# Patient Record
Sex: Female | Born: 1935 | Race: White | Hispanic: No | Smoking: Never smoker
Health system: Southern US, Community
[De-identification: ages and names within clinical notes are randomized; demographics above are authoritative.]

## PROBLEM LIST (undated history)

## (undated) DIAGNOSIS — K5792 Diverticulitis of intestine, part unspecified, without perforation or abscess without bleeding: Secondary | ICD-10-CM

## (undated) DIAGNOSIS — N200 Calculus of kidney: Secondary | ICD-10-CM

## (undated) DIAGNOSIS — I499 Cardiac arrhythmia, unspecified: Secondary | ICD-10-CM

## (undated) DIAGNOSIS — Z86718 Personal history of other venous thrombosis and embolism: Secondary | ICD-10-CM

## (undated) DIAGNOSIS — E039 Hypothyroidism, unspecified: Secondary | ICD-10-CM

## (undated) DIAGNOSIS — E079 Disorder of thyroid, unspecified: Secondary | ICD-10-CM

## (undated) DIAGNOSIS — E785 Hyperlipidemia, unspecified: Secondary | ICD-10-CM

## (undated) DIAGNOSIS — Z87442 Personal history of urinary calculi: Secondary | ICD-10-CM

## (undated) DIAGNOSIS — I10 Essential (primary) hypertension: Secondary | ICD-10-CM

## (undated) DIAGNOSIS — I4891 Unspecified atrial fibrillation: Secondary | ICD-10-CM

## (undated) DIAGNOSIS — I509 Heart failure, unspecified: Secondary | ICD-10-CM

## (undated) DIAGNOSIS — Z860100 Personal history of colon polyps, unspecified: Secondary | ICD-10-CM

## (undated) DIAGNOSIS — K5790 Diverticulosis of intestine, part unspecified, without perforation or abscess without bleeding: Secondary | ICD-10-CM

## (undated) DIAGNOSIS — K8020 Calculus of gallbladder without cholecystitis without obstruction: Secondary | ICD-10-CM

## (undated) DIAGNOSIS — J189 Pneumonia, unspecified organism: Secondary | ICD-10-CM

## (undated) DIAGNOSIS — T8859XA Other complications of anesthesia, initial encounter: Secondary | ICD-10-CM

## (undated) DIAGNOSIS — M1990 Unspecified osteoarthritis, unspecified site: Secondary | ICD-10-CM

## (undated) DIAGNOSIS — C801 Malignant (primary) neoplasm, unspecified: Secondary | ICD-10-CM

## (undated) DIAGNOSIS — K589 Irritable bowel syndrome without diarrhea: Secondary | ICD-10-CM

## (undated) DIAGNOSIS — K769 Liver disease, unspecified: Secondary | ICD-10-CM

## (undated) DIAGNOSIS — K219 Gastro-esophageal reflux disease without esophagitis: Secondary | ICD-10-CM

## (undated) DIAGNOSIS — K222 Esophageal obstruction: Secondary | ICD-10-CM

## (undated) DIAGNOSIS — T4145XA Adverse effect of unspecified anesthetic, initial encounter: Secondary | ICD-10-CM

## (undated) DIAGNOSIS — K7460 Unspecified cirrhosis of liver: Secondary | ICD-10-CM

## (undated) HISTORY — DX: Cardiac arrhythmia, unspecified: I49.9

## (undated) HISTORY — DX: Esophageal obstruction: K22.2

## (undated) HISTORY — PX: BIOPSY BREAST: PRO8

## (undated) HISTORY — DX: Hyperlipidemia, unspecified: E78.5

## (undated) HISTORY — DX: Calculus of kidney: N20.0

## (undated) HISTORY — PX: ABDOMINAL HYSTERECTOMY: SHX81

## (undated) HISTORY — PX: CHOLECYSTECTOMY: SHX55

## (undated) HISTORY — PX: BREAST LUMPECTOMY: SHX2

## (undated) HISTORY — DX: Calculus of gallbladder without cholecystitis without obstruction: K80.20

## (undated) HISTORY — DX: Unspecified osteoarthritis, unspecified site: M19.90

## (undated) HISTORY — PX: EYE SURGERY: SHX253

## (undated) HISTORY — PX: CATARACT EXTRACTION W/ INTRAOCULAR LENS IMPLANT: SHX1309

## (undated) HISTORY — DX: Diverticulitis of intestine, part unspecified, without perforation or abscess without bleeding: K57.92

## (undated) HISTORY — PX: ESOPHAGOGASTRODUODENOSCOPY: SHX1529

## (undated) HISTORY — PX: COLONOSCOPY: SHX174

## (undated) HISTORY — PX: BLADDER SURGERY: SHX569

## (undated) HISTORY — DX: Personal history of other venous thrombosis and embolism: Z86.718

## (undated) HISTORY — DX: Personal history of colon polyps, unspecified: Z86.0100

## (undated) HISTORY — PX: FEMUR SURGERY: SHX943

## (undated) HISTORY — DX: Personal history of colonic polyps: Z86.010

## (undated) HISTORY — DX: Heart failure, unspecified: I50.9

## (undated) HISTORY — DX: Irritable bowel syndrome, unspecified: K58.9

## (undated) HISTORY — PX: APPENDECTOMY: SHX54

## (undated) HISTORY — DX: Hemochromatosis, unspecified: E83.119

## (undated) HISTORY — PX: CARDIOVERSION: SHX1299

## (undated) HISTORY — DX: Liver disease, unspecified: K76.9

## (undated) HISTORY — DX: Diverticulosis of intestine, part unspecified, without perforation or abscess without bleeding: K57.90

## (undated) HISTORY — DX: Unspecified cirrhosis of liver: K74.60

## (undated) HISTORY — PX: HIP SURGERY: SHX245

## (undated) HISTORY — PX: ABDOMINAL SURGERY: SHX537

## (undated) HISTORY — DX: Gastro-esophageal reflux disease without esophagitis: K21.9

---
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
## 2013-11-22 DIAGNOSIS — J9811 Atelectasis: Secondary | ICD-10-CM | POA: Insufficient documentation

## 2015-02-25 DIAGNOSIS — H04123 Dry eye syndrome of bilateral lacrimal glands: Secondary | ICD-10-CM | POA: Diagnosis not present

## 2015-02-25 DIAGNOSIS — Z961 Presence of intraocular lens: Secondary | ICD-10-CM | POA: Diagnosis not present

## 2015-02-25 DIAGNOSIS — H353130 Nonexudative age-related macular degeneration, bilateral, stage unspecified: Secondary | ICD-10-CM | POA: Diagnosis not present

## 2015-05-14 DIAGNOSIS — N189 Chronic kidney disease, unspecified: Secondary | ICD-10-CM | POA: Diagnosis not present

## 2015-05-14 DIAGNOSIS — M79605 Pain in left leg: Secondary | ICD-10-CM | POA: Diagnosis not present

## 2015-05-14 DIAGNOSIS — E039 Hypothyroidism, unspecified: Secondary | ICD-10-CM | POA: Diagnosis not present

## 2015-05-14 DIAGNOSIS — E559 Vitamin D deficiency, unspecified: Secondary | ICD-10-CM | POA: Diagnosis not present

## 2015-05-14 DIAGNOSIS — D519 Vitamin B12 deficiency anemia, unspecified: Secondary | ICD-10-CM | POA: Diagnosis not present

## 2015-05-14 DIAGNOSIS — M545 Low back pain: Secondary | ICD-10-CM | POA: Diagnosis not present

## 2015-05-14 DIAGNOSIS — I4891 Unspecified atrial fibrillation: Secondary | ICD-10-CM | POA: Diagnosis not present

## 2015-05-14 DIAGNOSIS — I10 Essential (primary) hypertension: Secondary | ICD-10-CM | POA: Diagnosis not present

## 2015-05-15 DIAGNOSIS — N281 Cyst of kidney, acquired: Secondary | ICD-10-CM | POA: Diagnosis not present

## 2015-05-23 DIAGNOSIS — N281 Cyst of kidney, acquired: Secondary | ICD-10-CM | POA: Diagnosis not present

## 2015-05-23 DIAGNOSIS — R3121 Asymptomatic microscopic hematuria: Secondary | ICD-10-CM | POA: Diagnosis not present

## 2015-05-24 DIAGNOSIS — M545 Low back pain: Secondary | ICD-10-CM | POA: Diagnosis not present

## 2015-05-24 DIAGNOSIS — M79605 Pain in left leg: Secondary | ICD-10-CM | POA: Diagnosis not present

## 2015-05-28 DIAGNOSIS — M79605 Pain in left leg: Secondary | ICD-10-CM | POA: Diagnosis not present

## 2015-05-28 DIAGNOSIS — M545 Low back pain: Secondary | ICD-10-CM | POA: Diagnosis not present

## 2015-05-30 DIAGNOSIS — M79605 Pain in left leg: Secondary | ICD-10-CM | POA: Diagnosis not present

## 2015-05-30 DIAGNOSIS — M545 Low back pain: Secondary | ICD-10-CM | POA: Diagnosis not present

## 2015-06-04 DIAGNOSIS — M79605 Pain in left leg: Secondary | ICD-10-CM | POA: Diagnosis not present

## 2015-06-04 DIAGNOSIS — M545 Low back pain: Secondary | ICD-10-CM | POA: Diagnosis not present

## 2015-06-06 DIAGNOSIS — M79605 Pain in left leg: Secondary | ICD-10-CM | POA: Diagnosis not present

## 2015-06-06 DIAGNOSIS — M545 Low back pain: Secondary | ICD-10-CM | POA: Diagnosis not present

## 2015-06-11 DIAGNOSIS — M79605 Pain in left leg: Secondary | ICD-10-CM | POA: Diagnosis not present

## 2015-06-11 DIAGNOSIS — M545 Low back pain: Secondary | ICD-10-CM | POA: Diagnosis not present

## 2015-06-13 DIAGNOSIS — M545 Low back pain: Secondary | ICD-10-CM | POA: Diagnosis not present

## 2015-06-13 DIAGNOSIS — M79605 Pain in left leg: Secondary | ICD-10-CM | POA: Diagnosis not present

## 2015-06-19 DIAGNOSIS — Z13220 Encounter for screening for lipoid disorders: Secondary | ICD-10-CM | POA: Diagnosis not present

## 2015-06-19 DIAGNOSIS — Z79899 Other long term (current) drug therapy: Secondary | ICD-10-CM | POA: Diagnosis not present

## 2015-06-19 DIAGNOSIS — Z0000 Encounter for general adult medical examination without abnormal findings: Secondary | ICD-10-CM | POA: Diagnosis not present

## 2015-06-19 DIAGNOSIS — M79605 Pain in left leg: Secondary | ICD-10-CM | POA: Diagnosis not present

## 2015-06-19 DIAGNOSIS — M130 Polyarthritis, unspecified: Secondary | ICD-10-CM | POA: Diagnosis not present

## 2015-06-19 DIAGNOSIS — E039 Hypothyroidism, unspecified: Secondary | ICD-10-CM | POA: Diagnosis not present

## 2015-06-19 DIAGNOSIS — E559 Vitamin D deficiency, unspecified: Secondary | ICD-10-CM | POA: Diagnosis not present

## 2015-06-19 DIAGNOSIS — D519 Vitamin B12 deficiency anemia, unspecified: Secondary | ICD-10-CM | POA: Diagnosis not present

## 2015-06-19 DIAGNOSIS — I4891 Unspecified atrial fibrillation: Secondary | ICD-10-CM | POA: Diagnosis not present

## 2015-06-25 DIAGNOSIS — E782 Mixed hyperlipidemia: Secondary | ICD-10-CM | POA: Diagnosis not present

## 2015-06-25 DIAGNOSIS — I10 Essential (primary) hypertension: Secondary | ICD-10-CM | POA: Diagnosis not present

## 2015-06-25 DIAGNOSIS — I480 Paroxysmal atrial fibrillation: Secondary | ICD-10-CM | POA: Diagnosis not present

## 2015-08-14 DIAGNOSIS — M21612 Bunion of left foot: Secondary | ICD-10-CM | POA: Diagnosis not present

## 2015-08-14 DIAGNOSIS — M21611 Bunion of right foot: Secondary | ICD-10-CM | POA: Diagnosis not present

## 2015-08-14 DIAGNOSIS — I70203 Unspecified atherosclerosis of native arteries of extremities, bilateral legs: Secondary | ICD-10-CM | POA: Diagnosis not present

## 2015-08-14 DIAGNOSIS — B351 Tinea unguium: Secondary | ICD-10-CM | POA: Diagnosis not present

## 2015-08-14 DIAGNOSIS — L851 Acquired keratosis [keratoderma] palmaris et plantaris: Secondary | ICD-10-CM | POA: Diagnosis not present

## 2015-08-19 DIAGNOSIS — M79604 Pain in right leg: Secondary | ICD-10-CM | POA: Diagnosis not present

## 2015-08-19 DIAGNOSIS — T148 Other injury of unspecified body region: Secondary | ICD-10-CM | POA: Diagnosis not present

## 2015-08-19 DIAGNOSIS — L089 Local infection of the skin and subcutaneous tissue, unspecified: Secondary | ICD-10-CM | POA: Diagnosis not present

## 2015-09-13 DIAGNOSIS — S81801A Unspecified open wound, right lower leg, initial encounter: Secondary | ICD-10-CM | POA: Diagnosis not present

## 2015-09-18 DIAGNOSIS — I83012 Varicose veins of right lower extremity with ulcer of calf: Secondary | ICD-10-CM | POA: Diagnosis not present

## 2015-09-18 DIAGNOSIS — L97911 Non-pressure chronic ulcer of unspecified part of right lower leg limited to breakdown of skin: Secondary | ICD-10-CM | POA: Diagnosis not present

## 2015-09-18 DIAGNOSIS — I872 Venous insufficiency (chronic) (peripheral): Secondary | ICD-10-CM | POA: Diagnosis not present

## 2015-09-18 DIAGNOSIS — I8392 Asymptomatic varicose veins of left lower extremity: Secondary | ICD-10-CM | POA: Diagnosis not present

## 2015-09-18 DIAGNOSIS — L97222 Non-pressure chronic ulcer of left calf with fat layer exposed: Secondary | ICD-10-CM | POA: Diagnosis not present

## 2015-09-20 DIAGNOSIS — Z6831 Body mass index (BMI) 31.0-31.9, adult: Secondary | ICD-10-CM | POA: Diagnosis not present

## 2015-09-20 DIAGNOSIS — Z23 Encounter for immunization: Secondary | ICD-10-CM | POA: Diagnosis not present

## 2015-09-20 DIAGNOSIS — E039 Hypothyroidism, unspecified: Secondary | ICD-10-CM | POA: Diagnosis not present

## 2015-09-20 DIAGNOSIS — I10 Essential (primary) hypertension: Secondary | ICD-10-CM | POA: Diagnosis not present

## 2015-09-20 DIAGNOSIS — E559 Vitamin D deficiency, unspecified: Secondary | ICD-10-CM | POA: Diagnosis not present

## 2015-09-20 DIAGNOSIS — E785 Hyperlipidemia, unspecified: Secondary | ICD-10-CM | POA: Diagnosis not present

## 2015-09-20 DIAGNOSIS — H6122 Impacted cerumen, left ear: Secondary | ICD-10-CM | POA: Diagnosis not present

## 2015-09-25 DIAGNOSIS — I87311 Chronic venous hypertension (idiopathic) with ulcer of right lower extremity: Secondary | ICD-10-CM | POA: Diagnosis not present

## 2015-09-25 DIAGNOSIS — I872 Venous insufficiency (chronic) (peripheral): Secondary | ICD-10-CM | POA: Diagnosis not present

## 2015-09-25 DIAGNOSIS — L97222 Non-pressure chronic ulcer of left calf with fat layer exposed: Secondary | ICD-10-CM | POA: Diagnosis not present

## 2015-09-25 DIAGNOSIS — L97811 Non-pressure chronic ulcer of other part of right lower leg limited to breakdown of skin: Secondary | ICD-10-CM | POA: Diagnosis not present

## 2015-10-02 DIAGNOSIS — Z872 Personal history of diseases of the skin and subcutaneous tissue: Secondary | ICD-10-CM | POA: Diagnosis not present

## 2015-10-02 DIAGNOSIS — Z09 Encounter for follow-up examination after completed treatment for conditions other than malignant neoplasm: Secondary | ICD-10-CM | POA: Diagnosis not present

## 2015-10-02 DIAGNOSIS — I872 Venous insufficiency (chronic) (peripheral): Secondary | ICD-10-CM | POA: Diagnosis not present

## 2015-10-10 DIAGNOSIS — H11151 Pinguecula, right eye: Secondary | ICD-10-CM | POA: Diagnosis not present

## 2015-10-31 ENCOUNTER — Emergency Department (HOSPITAL_COMMUNITY): Payer: Medicare Other

## 2015-10-31 ENCOUNTER — Emergency Department (HOSPITAL_COMMUNITY)
Admission: EM | Admit: 2015-10-31 | Discharge: 2015-10-31 | Disposition: A | Discharge status: Home / Self Care | Payer: Medicare Other | Attending: Emergency Medicine | Admitting: Emergency Medicine

## 2015-10-31 ENCOUNTER — Encounter (HOSPITAL_COMMUNITY): Payer: Self-pay

## 2015-10-31 DIAGNOSIS — I10 Essential (primary) hypertension: Secondary | ICD-10-CM | POA: Diagnosis not present

## 2015-10-31 DIAGNOSIS — R1032 Left lower quadrant pain: Secondary | ICD-10-CM | POA: Diagnosis present

## 2015-10-31 DIAGNOSIS — K5732 Diverticulitis of large intestine without perforation or abscess without bleeding: Secondary | ICD-10-CM | POA: Insufficient documentation

## 2015-10-31 DIAGNOSIS — K5792 Diverticulitis of intestine, part unspecified, without perforation or abscess without bleeding: Secondary | ICD-10-CM | POA: Diagnosis not present

## 2015-10-31 DIAGNOSIS — Z859 Personal history of malignant neoplasm, unspecified: Secondary | ICD-10-CM | POA: Insufficient documentation

## 2015-10-31 HISTORY — DX: Unspecified atrial fibrillation: I48.91

## 2015-10-31 HISTORY — DX: Essential (primary) hypertension: I10

## 2015-10-31 HISTORY — DX: Disorder of thyroid, unspecified: E07.9

## 2015-10-31 HISTORY — DX: Malignant (primary) neoplasm, unspecified: C80.1

## 2015-10-31 LAB — URINALYSIS, ROUTINE W REFLEX MICROSCOPIC
BILIRUBIN URINE: NEGATIVE
Glucose, UA: NEGATIVE mg/dL
Ketones, ur: NEGATIVE mg/dL
NITRITE: NEGATIVE
PH: 6 (ref 5.0–8.0)
Protein, ur: NEGATIVE mg/dL
SPECIFIC GRAVITY, URINE: 1.011 (ref 1.005–1.030)

## 2015-10-31 LAB — CBC WITH DIFFERENTIAL/PLATELET
BASOS PCT: 1 %
Basophils Absolute: 0.1 10*3/uL (ref 0.0–0.1)
EOS ABS: 0.1 10*3/uL (ref 0.0–0.7)
EOS PCT: 1 %
HCT: 40.8 % (ref 36.0–46.0)
HEMOGLOBIN: 13.2 g/dL (ref 12.0–15.0)
LYMPHS ABS: 2.2 10*3/uL (ref 0.7–4.0)
Lymphocytes Relative: 24 %
MCH: 32.4 pg (ref 26.0–34.0)
MCHC: 32.4 g/dL (ref 30.0–36.0)
MCV: 100.2 fL — ABNORMAL HIGH (ref 78.0–100.0)
Monocytes Absolute: 1.2 10*3/uL — ABNORMAL HIGH (ref 0.1–1.0)
Monocytes Relative: 13 %
NEUTROS PCT: 61 %
Neutro Abs: 5.7 10*3/uL (ref 1.7–7.7)
Platelets: 297 10*3/uL (ref 150–400)
RBC: 4.07 MIL/uL (ref 3.87–5.11)
RDW: 13.8 % (ref 11.5–15.5)
WBC: 9.3 10*3/uL (ref 4.0–10.5)

## 2015-10-31 LAB — COMPREHENSIVE METABOLIC PANEL
ALBUMIN: 3.4 g/dL — AB (ref 3.5–5.0)
ALK PHOS: 69 U/L (ref 38–126)
ALT: 14 U/L (ref 14–54)
AST: 16 U/L (ref 15–41)
Anion gap: 6 (ref 5–15)
BILIRUBIN TOTAL: 0.8 mg/dL (ref 0.3–1.2)
BUN: 8 mg/dL (ref 6–20)
CALCIUM: 8.7 mg/dL — AB (ref 8.9–10.3)
CO2: 26 mmol/L (ref 22–32)
Chloride: 106 mmol/L (ref 101–111)
Creatinine, Ser: 0.71 mg/dL (ref 0.44–1.00)
GFR calc Af Amer: >60 mL/min (ref >60)
GFR calc non Af Amer: >60 mL/min (ref >60)
GLUCOSE: 104 mg/dL — AB (ref 65–99)
Potassium: 3.7 mmol/L (ref 3.5–5.1)
Sodium: 138 mmol/L (ref 135–145)
TOTAL PROTEIN: 6.4 g/dL — AB (ref 6.5–8.1)

## 2015-10-31 LAB — URINE MICROSCOPIC-ADD ON

## 2015-10-31 LAB — LIPASE, BLOOD: Lipase: 31 U/L (ref 11–51)

## 2015-10-31 LAB — I-STAT CG4 LACTIC ACID, ED: Lactic Acid, Venous: 0.87 mmol/L (ref 0.5–1.9)

## 2015-10-31 MED ORDER — CEFDINIR 300 MG PO CAPS: 300.0000 mg | ORAL_CAPSULE | Freq: Two times a day (BID) | ORAL | 0 refills | Status: AC

## 2015-10-31 MED ORDER — FENTANYL CITRATE (PF) 100 MCG/2ML IJ SOLN: 25.0000 ug | Freq: Once | INTRAMUSCULAR | Status: DC

## 2015-10-31 MED ORDER — METRONIDAZOLE 500 MG PO TABS: 500.0000 mg | ORAL_TABLET | Freq: Three times a day (TID) | ORAL | 0 refills | Status: AC

## 2015-10-31 NOTE — ED Notes (Signed)
Pt ambulated to restroom with assistance and her walker. Pt tolerated well.

## 2015-10-31 NOTE — ED Triage Notes (Signed)
Pt presents with 1 week h/o abdominal pain that is to lower abdomen, reports pain is constant; and increases with bowel movements and eating.  Pt seen at a urgent care today and referred here.

## 2015-10-31 NOTE — ED Provider Notes (Signed)
[facility] DEPT Provider Note   CSN: [id number] Arrival date & time: 10/31/15  1226     History   Chief Complaint Chief Complaint  Patient presents with  . Abdominal Pain    HPI Deborah Ball is a 82 y.o. female.  HPI 82 year old female with past medical history of hypertension and A. fib on [place] who presents with bilateral lower quadrant abdominal pain patient has an extensive history of recurrent diverticulitis but has never required surgery for this. She states that over the last week she has had per grossly worsening gradual onset of bilateral lower quadrant pain worse on the left greater than right. She describes the pain as an aching gnawing sensation that is made worse with bowel movements. She has had some mild associated nausea but no vomiting no blood in her stools no fevers or chills her symptoms feel similar to her previous episodes of diverticulitis she presented to urgent care earlier today who sent her to the ED for further evaluation  Past Medical History:  Diagnosis Date  . A-fib ([place])   . Cancer ([place])   . Hypertension   . Thyroid disease     There are no active problems to display for this patient.   Past Surgical History:  Procedure Laterality Date  . ABDOMINAL HYSTERECTOMY    . ABDOMINAL SURGERY    . APPENDECTOMY    . BREAST SURGERY    . CHOLECYSTECTOMY      OB History    No data available       Home Medications    Prior to Admission medications   Medication Sig Start Date End Date Taking? Authorizing Provider  cefdinir (OMNICEF) 300 MG capsule Take 1 capsule (300 mg total) by mouth 2 (two) times daily. 10/31/15 11/10/15  Duffy Bruce, MD  metroNIDAZOLE (FLAGYL) 500 MG tablet Take 1 tablet (500 mg total) by mouth 3 (three) times daily. 10/31/15 11/10/15  Duffy Bruce, MD    Family History History reviewed. No pertinent family history.  Social History Social History  Substance Use Topics  . Smoking status: Never Smoker  .  Smokeless tobacco: Never Used  . Alcohol use No     Allergies   Review of patient's allergies indicates not on file.   Review of Systems Review of Systems  Constitutional: Negative for chills and fever.  HENT: Negative for congestion, rhinorrhea and sore throat.   Eyes: Negative for visual disturbance.  Respiratory: Negative for cough and wheezing.   Cardiovascular: Negative for chest pain and leg swelling.  Gastrointestinal: Positive for abdominal pain and nausea. Negative for blood in stool, diarrhea and vomiting.  Genitourinary: Negative for dysuria, flank pain, vaginal bleeding and vaginal discharge.  Musculoskeletal: Negative for neck pain.  Skin: Negative for rash.  Allergic/Immunologic: Negative for immunocompromised state.  Neurological: Negative for syncope and headaches.  Hematological: Does not bruise/bleed easily.  All other systems reviewed and are negative.    Physical Exam Updated Vital Signs BP 124/71 (BP Location: Right Arm)   Pulse 71   Temp 97.9 F (36.6 C) (Oral)   Resp 19   Ht 5\' 4"  (1.626 m)   Wt 175 lb (79.4 kg)   SpO2 95%   BMI 30.04 kg/m   Physical Exam  Constitutional: She is oriented to person, place, and time. She appears well-developed and well-nourished. No distress.  HENT:  Head: Normocephalic and atraumatic.  Mouth/Throat: Oropharynx is clear and moist.  Eyes: Conjunctivae are normal. Pupils are equal, round, and reactive to light.  Neck: Neck supple.  Cardiovascular: Normal rate, regular rhythm and normal heart sounds.  Exam reveals no friction rub.   No murmur heard. Pulmonary/Chest: Effort normal and breath sounds normal. No respiratory distress. She has no wheezes. She has no rales.  Abdominal: Soft. Bowel sounds are normal. She exhibits no distension. There is tenderness (Mild tenderness in bilateral lower quadrants with no rebound or guarding). There is no rebound and no guarding.  Musculoskeletal: She exhibits no edema.    Neurological: She is alert and oriented to person, place, and time. She exhibits normal muscle tone.  Skin: Skin is warm. Capillary refill takes less than 2 seconds.  Psychiatric: She has a normal mood and affect.  Nursing note and vitals reviewed.    ED Treatments / Results  Labs (all labs ordered are listed, but only abnormal results are displayed) Labs Reviewed  CBC WITH DIFFERENTIAL/PLATELET - Abnormal; Notable for the following:       Result Value   MCV 100.2 (*)    Monocytes Absolute 1.2 (*)    All other components within normal limits  COMPREHENSIVE METABOLIC PANEL - Abnormal; Notable for the following:    Glucose, Bld 104 (*)    Calcium 8.7 (*)    Total Protein 6.4 (*)    Albumin 3.4 (*)    All other components within normal limits  URINALYSIS, ROUTINE W REFLEX MICROSCOPIC (NOT AT [facility]) - Abnormal; Notable for the following:    APPearance CLOUDY (*)    Hgb urine dipstick TRACE (*)    Leukocytes, UA TRACE (*)    All other components within normal limits  URINE MICROSCOPIC-ADD ON - Abnormal; Notable for the following:    Squamous Epithelial / LPF 0-5 (*)    Bacteria, UA RARE (*)    All other components within normal limits  LIPASE, BLOOD  I-STAT CG4 LACTIC ACID, ED  I-STAT CG4 LACTIC ACID, ED    EKG  EKG Interpretation None       Radiology Ct Abdomen Pelvis Wo Contrast  Result Date: 10/31/2015 CLINICAL DATA:  Lower abdominal pain EXAM: CT ABDOMEN AND PELVIS WITHOUT CONTRAST TECHNIQUE: Multidetector CT imaging of the abdomen and pelvis was performed following the standard protocol without IV contrast. COMPARISON:  None. FINDINGS: Lower chest: No pulmonary nodules. No visible pleural or pericardial effusion. There is atherosclerotic calcification within the coronary arteries and aorta. Hepatobiliary: There are multiple low-attenuation lesions within the liver, with the largest measuring 4.4 cm. These are most consistent with hepatic cysts. Some of the lesions are  less than 1 cm in size and best too small to characterize accurately. No ascites. No biliary dilatation. Gallbladder is absent. A calcified granuloma is noted in the liver. Pancreas: Normal pancreatic contours. No peripancreatic fluid collection or pancreatic ductal dilatation. Spleen: Normal. Adrenals/Urinary Tract: Normal adrenal glands. Multiple bilateral low-attenuation renal lesions, smaller than 1 cm in too small to characterize accurately but statistically most likely to be renal cysts. No hydronephrosis. Stomach/Bowel: There is wall thickening of the sigmoid colon with associated adjacent inflammatory stranding. There is no fluid collection or free intraperitoneal air. There are numerous sigmoid diverticula. The remainder of the colon is normal. There is no small bowel dilatation. Despite review of multiplanar reformatted images in 3 orthogonal planes, the appendix could not be adequately visualized. However, there is no inflammatory stranding or free fluid within the right lower quadrant. Vascular/Lymphatic: There is atherosclerotic calcification of the abdominal aorta and its branches. No aneurysm. No abdominal or pelvic adenopathy. Reproductive:  Complex cystic appearance of the left adnexa. Unremarkable uterus and right ovary. Musculoskeletal: Intra medullary rod within the proximal right femur with interlocking screw traversing the femoral neck. Multilevel lumbar osteophytosis and facet arthrosis. No bony spinal canal stenosis. No lytic or blastic lesions. Normal visualized extrathoracic and extraperitoneal soft tissues. Other: No contributory non-categorized findings. IMPRESSION: 1. Acute diverticulitis of the sigmoid colon. No evidence of perforation or abscess formation. 2. Multiple low-attenuation liver lesions, favored to represent hepatic cysts, but incompletely evaluated in the absence of IV contrast. 3. **An incidental finding of potential clinical significance has been found. Complex cystic  focus of the left adnexa. In a patient of this age, further characterization with pelvic ultrasound is recommended. This follows the recommendations of the white paper Managing Incidental Findings on Abdominal and Pelvic CT and MRI: Part 1: White Paper of the ACR Incidental Findings Committee II on Adnexal Findings. Posey Pronto MD et al. Bernette Redbird 2013 Sept;10(9):675-681.** Electronically Signed   By: Ulyses Jarred M.D.   On: 10/31/2015 14:57    Procedures Procedures (including critical care time)  Medications Ordered in ED Medications  fentaNYL (SUBLIMAZE) injection 25 mcg (25 mcg Intravenous Refused 10/31/15 1605)     Initial Impression / Assessment and Plan / ED Course  I have reviewed the triage vital signs and the nursing notes.  Pertinent labs & imaging results that were available during my care of the patient were reviewed by me and considered in my medical decision making (see chart for details).  Clinical Course  82 year old female with past medical history of A. fib, hypertension, and recurrent diverticulitis who presents with bilateral lower quadrant abdominal pain. No fevers or chills. Symptoms feel similar to her previous episodes of diverticulitis. On arrival, vital signs are stable and within normal limits. She's had no fevers or signs of systemic illness. Labwork shows no leukocytosis, normal renal function, and no other significant abnormalities. CT scan obtained shows acute, uncompensated diverticulitis. No evidence of abscess or perforation. She does have a left adnexal mass which she is aware of based my discussion with her.Given patient's otherwise well appearance, tolerance of by mouth,and absence of any leukocytosis, fever, or signs of systemic infection, will discharge with antibiotics. Patient has significant allergies but based on discussion with the patient as well as pharmacy, will send home on [place] with Flagyl and outpatient follow-up.  Final Clinical Impressions(s) / ED  Diagnoses   Final diagnoses:  Diverticulitis of large intestine without perforation or abscess without bleeding    New Prescriptions Discharge Medication List as of 10/31/2015  3:28 PM    START taking these medications   Details  cefdinir (OMNICEF) 300 MG capsule Take 1 capsule (300 mg total) by mouth 2 (two) times daily., Starting Thu 10/31/2015, Until Sun 11/10/2015, Print    metroNIDAZOLE (FLAGYL) 500 MG tablet Take 1 tablet (500 mg total) by mouth 3 (three) times daily., Starting Thu 10/31/2015, Until Sun 11/10/2015, Print         Duffy Bruce, MD 10/31/15 [phone]

## 2015-11-04 DIAGNOSIS — H11151 Pinguecula, right eye: Secondary | ICD-10-CM | POA: Diagnosis not present

## 2015-11-04 DIAGNOSIS — H11001 Unspecified pterygium of right eye: Secondary | ICD-10-CM | POA: Diagnosis not present

## 2015-11-06 DIAGNOSIS — Z6830 Body mass index (BMI) 30.0-30.9, adult: Secondary | ICD-10-CM | POA: Diagnosis not present

## 2015-11-06 DIAGNOSIS — E785 Hyperlipidemia, unspecified: Secondary | ICD-10-CM | POA: Diagnosis not present

## 2015-11-06 DIAGNOSIS — R1013 Epigastric pain: Secondary | ICD-10-CM | POA: Diagnosis not present

## 2015-11-06 DIAGNOSIS — I480 Paroxysmal atrial fibrillation: Secondary | ICD-10-CM | POA: Diagnosis not present

## 2015-11-06 DIAGNOSIS — E039 Hypothyroidism, unspecified: Secondary | ICD-10-CM | POA: Diagnosis not present

## 2015-11-06 DIAGNOSIS — K5732 Diverticulitis of large intestine without perforation or abscess without bleeding: Secondary | ICD-10-CM | POA: Diagnosis not present

## 2015-11-06 DIAGNOSIS — E559 Vitamin D deficiency, unspecified: Secondary | ICD-10-CM | POA: Diagnosis not present

## 2015-11-06 DIAGNOSIS — K219 Gastro-esophageal reflux disease without esophagitis: Secondary | ICD-10-CM | POA: Diagnosis not present

## 2015-11-06 DIAGNOSIS — R110 Nausea: Secondary | ICD-10-CM | POA: Diagnosis not present

## 2015-11-06 DIAGNOSIS — I10 Essential (primary) hypertension: Secondary | ICD-10-CM | POA: Diagnosis not present

## 2015-12-06 DIAGNOSIS — C44629 Squamous cell carcinoma of skin of left upper limb, including shoulder: Secondary | ICD-10-CM | POA: Diagnosis not present

## 2015-12-06 DIAGNOSIS — L570 Actinic keratosis: Secondary | ICD-10-CM | POA: Diagnosis not present

## 2015-12-30 DIAGNOSIS — I480 Paroxysmal atrial fibrillation: Secondary | ICD-10-CM | POA: Diagnosis not present

## 2015-12-30 DIAGNOSIS — E782 Mixed hyperlipidemia: Secondary | ICD-10-CM | POA: Diagnosis not present

## 2015-12-30 DIAGNOSIS — I10 Essential (primary) hypertension: Secondary | ICD-10-CM | POA: Diagnosis not present

## 2016-01-06 DIAGNOSIS — H11001 Unspecified pterygium of right eye: Secondary | ICD-10-CM | POA: Diagnosis not present

## 2016-01-06 DIAGNOSIS — I481 Persistent atrial fibrillation: Secondary | ICD-10-CM | POA: Diagnosis not present

## 2016-01-10 DIAGNOSIS — R9431 Abnormal electrocardiogram [ECG] [EKG]: Secondary | ICD-10-CM | POA: Diagnosis not present

## 2016-01-10 DIAGNOSIS — I481 Persistent atrial fibrillation: Secondary | ICD-10-CM | POA: Diagnosis not present

## 2016-01-15 DIAGNOSIS — S8392XA Sprain of unspecified site of left knee, initial encounter: Secondary | ICD-10-CM | POA: Diagnosis not present

## 2016-01-22 DIAGNOSIS — C44629 Squamous cell carcinoma of skin of left upper limb, including shoulder: Secondary | ICD-10-CM | POA: Diagnosis not present

## 2016-01-23 DIAGNOSIS — D487 Neoplasm of uncertain behavior of other specified sites: Secondary | ICD-10-CM | POA: Diagnosis not present

## 2016-01-28 DIAGNOSIS — E782 Mixed hyperlipidemia: Secondary | ICD-10-CM | POA: Diagnosis not present

## 2016-01-28 DIAGNOSIS — I10 Essential (primary) hypertension: Secondary | ICD-10-CM | POA: Diagnosis not present

## 2016-01-28 DIAGNOSIS — I480 Paroxysmal atrial fibrillation: Secondary | ICD-10-CM | POA: Diagnosis not present

## 2016-02-19 DIAGNOSIS — I10 Essential (primary) hypertension: Secondary | ICD-10-CM | POA: Diagnosis not present

## 2016-02-19 DIAGNOSIS — E039 Hypothyroidism, unspecified: Secondary | ICD-10-CM | POA: Diagnosis not present

## 2016-02-19 DIAGNOSIS — M25579 Pain in unspecified ankle and joints of unspecified foot: Secondary | ICD-10-CM | POA: Diagnosis not present

## 2016-02-19 DIAGNOSIS — I4891 Unspecified atrial fibrillation: Secondary | ICD-10-CM | POA: Diagnosis not present

## 2016-03-02 DIAGNOSIS — D487 Neoplasm of uncertain behavior of other specified sites: Secondary | ICD-10-CM | POA: Diagnosis not present

## 2016-03-02 DIAGNOSIS — H3552 Pigmentary retinal dystrophy: Secondary | ICD-10-CM | POA: Diagnosis not present

## 2016-03-04 DIAGNOSIS — Z8639 Personal history of other endocrine, nutritional and metabolic disease: Secondary | ICD-10-CM | POA: Insufficient documentation

## 2016-03-04 DIAGNOSIS — Z8614 Personal history of Methicillin resistant Staphylococcus aureus infection: Secondary | ICD-10-CM | POA: Insufficient documentation

## 2016-03-04 DIAGNOSIS — I482 Chronic atrial fibrillation: Secondary | ICD-10-CM | POA: Diagnosis not present

## 2016-03-04 DIAGNOSIS — I10 Essential (primary) hypertension: Secondary | ICD-10-CM | POA: Diagnosis not present

## 2016-03-04 DIAGNOSIS — Z22322 Carrier or suspected carrier of Methicillin resistant Staphylococcus aureus: Secondary | ICD-10-CM | POA: Diagnosis not present

## 2016-03-04 DIAGNOSIS — K5792 Diverticulitis of intestine, part unspecified, without perforation or abscess without bleeding: Secondary | ICD-10-CM | POA: Diagnosis not present

## 2016-03-04 DIAGNOSIS — H579 Unspecified disorder of eye and adnexa: Secondary | ICD-10-CM | POA: Diagnosis not present

## 2016-03-04 DIAGNOSIS — B9562 Methicillin resistant Staphylococcus aureus infection as the cause of diseases classified elsewhere: Secondary | ICD-10-CM | POA: Diagnosis not present

## 2016-03-04 DIAGNOSIS — L0390 Cellulitis, unspecified: Secondary | ICD-10-CM | POA: Diagnosis not present

## 2016-03-06 DIAGNOSIS — D487 Neoplasm of uncertain behavior of other specified sites: Secondary | ICD-10-CM | POA: Diagnosis not present

## 2016-03-06 DIAGNOSIS — C6901 Malignant neoplasm of right conjunctiva: Secondary | ICD-10-CM | POA: Diagnosis not present

## 2016-03-06 DIAGNOSIS — D0921 Carcinoma in situ of right eye: Secondary | ICD-10-CM | POA: Diagnosis not present

## 2016-04-07 DIAGNOSIS — I10 Essential (primary) hypertension: Secondary | ICD-10-CM | POA: Diagnosis not present

## 2016-04-07 DIAGNOSIS — E782 Mixed hyperlipidemia: Secondary | ICD-10-CM | POA: Diagnosis not present

## 2016-04-07 DIAGNOSIS — I480 Paroxysmal atrial fibrillation: Secondary | ICD-10-CM | POA: Diagnosis not present

## 2016-04-13 DIAGNOSIS — I4891 Unspecified atrial fibrillation: Secondary | ICD-10-CM | POA: Diagnosis not present

## 2016-04-17 ENCOUNTER — Ambulatory Visit: Payer: Medicare Other | Admitting: Sports Medicine

## 2016-05-07 ENCOUNTER — Ambulatory Visit (INDEPENDENT_AMBULATORY_CARE_PROVIDER_SITE_OTHER): Payer: Medicare Other | Admitting: Sports Medicine

## 2016-05-07 ENCOUNTER — Encounter: Payer: Self-pay | Admitting: Sports Medicine

## 2016-05-07 DIAGNOSIS — M79672 Pain in left foot: Secondary | ICD-10-CM

## 2016-05-07 DIAGNOSIS — M79671 Pain in right foot: Secondary | ICD-10-CM | POA: Diagnosis not present

## 2016-05-07 DIAGNOSIS — B351 Tinea unguium: Secondary | ICD-10-CM

## 2016-05-07 DIAGNOSIS — I739 Peripheral vascular disease, unspecified: Secondary | ICD-10-CM

## 2016-05-07 DIAGNOSIS — Z7901 Long term (current) use of anticoagulants: Secondary | ICD-10-CM

## 2016-05-07 DIAGNOSIS — L84 Corns and callosities: Secondary | ICD-10-CM

## 2016-05-07 NOTE — Progress Notes (Signed)
Subjective: Deborah Ball is a 83 y.o. female patient seen today in office with complaint of painful thickened and elongated toenails and corn on right 5th toe>bottom of right foot; unable to trim. Patient denies history of Diabetes or Neuropathy. On Xarelto for Afib. Patient has no other pedal complaints at this time.   Patient assisted by Daughter at this visit.  There are no active problems to display for this patient.   No current outpatient prescriptions on file prior to visit.   No current facility-administered medications on file prior to visit.     Allergies  Allergen Reactions  . Aspirin     Other reaction(s): OTHER  . Ciprofloxacin     Other reaction(s): OTHER  . Penicillins   . Phenobarbital     Other reaction(s): UNKNOWN  . Statins     Other reaction(s): OTHER  . Sulfa Antibiotics     Other reaction(s): RASH    Objective: Physical Exam  General: Well developed, nourished, no acute distress, awake, alert and oriented x 3  Vascular: Dorsalis pedis artery 1/4 bilateral, Posterior tibial artery 1/4 bilateral, skin temperature warm to warm proximal to distal bilateral lower extremities, + varicosities, scant pedal hair present bilateral.  Neurological: Gross sensation present via light touch bilateral.   Dermatological: Skin is warm, dry, and supple bilateral, Nails 1-10 are tender, long, thick, and discolored with mild subungal debris, no webspace macerations present bilateral, no open lesions present bilateral, + hyperkeratotic tissue present right 5th toe and sub met 1 on right. No signs of infection bilateral.  Musculoskeletal: Asymptomatic hammertoe boney deformities noted bilateral. Muscular strength within normal limits without painon range of motion. No pain with calf compression bilateral.  Assessment and Plan:  Problem List Items Addressed This Visit    None    Visit Diagnoses    Dermatophytosis of nail    -  Primary   Corns and callosities       Foot  pain, bilateral       Current use of long term anticoagulation       PVD (peripheral vascular disease) (HCC)       Relevant Medications   amiodarone (PACERONE) 400 MG tablet   diltiazem (CARTIA XT) 240 MG 24 hr capsule   rivaroxaban (XARELTO) 20 MG TABS tablet      -Examined patient.  -Discussed treatment options for painful mycotic nails. -Mechanically debrided callus x 2 using sterile chisel blade and reduced mycotic nails with sterile nail nipper and dremel nail file without incident. -Patient to return in 3 months for follow up evaluation or sooner if symptoms worsen.  Landis Martins, DPM

## 2016-05-07 NOTE — Progress Notes (Signed)
   Subjective:    Patient ID: Deborah Ball, female    DOB: 18-Feb-1922, 83 y.o.   MRN: [id number]  HPI   I am here to get my toenails trimmed and I have a corn on my right 5 th toe     Review of Systems  Allergic/Immunologic:       Contrast dye  All other systems reviewed and are negative.      Objective:   Physical Exam        Assessment & Plan:

## 2016-05-19 DIAGNOSIS — C44629 Squamous cell carcinoma of skin of left upper limb, including shoulder: Secondary | ICD-10-CM | POA: Diagnosis not present

## 2016-05-22 DIAGNOSIS — Q6102 Congenital multiple renal cysts: Secondary | ICD-10-CM | POA: Diagnosis not present

## 2016-05-22 DIAGNOSIS — M25579 Pain in unspecified ankle and joints of unspecified foot: Secondary | ICD-10-CM | POA: Diagnosis not present

## 2016-05-22 DIAGNOSIS — Z85840 Personal history of malignant neoplasm of eye: Secondary | ICD-10-CM | POA: Diagnosis not present

## 2016-05-22 DIAGNOSIS — M6530 Trigger finger, unspecified finger: Secondary | ICD-10-CM | POA: Diagnosis not present

## 2016-05-22 DIAGNOSIS — N281 Cyst of kidney, acquired: Secondary | ICD-10-CM | POA: Diagnosis not present

## 2016-05-22 DIAGNOSIS — I10 Essential (primary) hypertension: Secondary | ICD-10-CM | POA: Diagnosis not present

## 2016-05-22 DIAGNOSIS — E039 Hypothyroidism, unspecified: Secondary | ICD-10-CM | POA: Diagnosis not present

## 2016-05-22 DIAGNOSIS — I4891 Unspecified atrial fibrillation: Secondary | ICD-10-CM | POA: Diagnosis not present

## 2016-05-22 DIAGNOSIS — R3121 Asymptomatic microscopic hematuria: Secondary | ICD-10-CM | POA: Diagnosis not present

## 2016-05-31 DIAGNOSIS — R05 Cough: Secondary | ICD-10-CM | POA: Diagnosis not present

## 2016-05-31 DIAGNOSIS — R531 Weakness: Secondary | ICD-10-CM | POA: Diagnosis not present

## 2016-05-31 DIAGNOSIS — R918 Other nonspecific abnormal finding of lung field: Secondary | ICD-10-CM | POA: Diagnosis not present

## 2016-05-31 DIAGNOSIS — I10 Essential (primary) hypertension: Secondary | ICD-10-CM | POA: Diagnosis not present

## 2016-05-31 DIAGNOSIS — R404 Transient alteration of awareness: Secondary | ICD-10-CM | POA: Diagnosis not present

## 2016-05-31 DIAGNOSIS — C50919 Malignant neoplasm of unspecified site of unspecified female breast: Secondary | ICD-10-CM | POA: Diagnosis not present

## 2016-06-23 DIAGNOSIS — I481 Persistent atrial fibrillation: Secondary | ICD-10-CM | POA: Diagnosis not present

## 2016-06-23 DIAGNOSIS — E782 Mixed hyperlipidemia: Secondary | ICD-10-CM | POA: Diagnosis not present

## 2016-06-23 DIAGNOSIS — I10 Essential (primary) hypertension: Secondary | ICD-10-CM | POA: Diagnosis not present

## 2016-06-25 DIAGNOSIS — Z8249 Family history of ischemic heart disease and other diseases of the circulatory system: Secondary | ICD-10-CM | POA: Diagnosis not present

## 2016-06-25 DIAGNOSIS — I2510 Atherosclerotic heart disease of native coronary artery without angina pectoris: Secondary | ICD-10-CM | POA: Diagnosis not present

## 2016-06-25 DIAGNOSIS — I10 Essential (primary) hypertension: Secondary | ICD-10-CM | POA: Diagnosis not present

## 2016-06-25 DIAGNOSIS — Z7901 Long term (current) use of anticoagulants: Secondary | ICD-10-CM | POA: Diagnosis not present

## 2016-06-25 DIAGNOSIS — E782 Mixed hyperlipidemia: Secondary | ICD-10-CM | POA: Diagnosis not present

## 2016-06-25 DIAGNOSIS — E039 Hypothyroidism, unspecified: Secondary | ICD-10-CM | POA: Diagnosis not present

## 2016-06-25 DIAGNOSIS — I481 Persistent atrial fibrillation: Secondary | ICD-10-CM | POA: Diagnosis not present

## 2016-07-16 DIAGNOSIS — S9031XA Contusion of right foot, initial encounter: Secondary | ICD-10-CM | POA: Diagnosis not present

## 2016-07-16 DIAGNOSIS — W19XXXA Unspecified fall, initial encounter: Secondary | ICD-10-CM | POA: Diagnosis not present

## 2016-07-17 DIAGNOSIS — M545 Low back pain: Secondary | ICD-10-CM | POA: Diagnosis not present

## 2016-07-17 DIAGNOSIS — M79671 Pain in right foot: Secondary | ICD-10-CM | POA: Diagnosis not present

## 2016-07-17 DIAGNOSIS — M5136 Other intervertebral disc degeneration, lumbar region: Secondary | ICD-10-CM | POA: Diagnosis not present

## 2016-07-23 DIAGNOSIS — D0921 Carcinoma in situ of right eye: Secondary | ICD-10-CM | POA: Diagnosis not present

## 2016-08-06 DIAGNOSIS — S92344A Nondisplaced fracture of fourth metatarsal bone, right foot, initial encounter for closed fracture: Secondary | ICD-10-CM | POA: Diagnosis not present

## 2016-08-06 DIAGNOSIS — E559 Vitamin D deficiency, unspecified: Secondary | ICD-10-CM | POA: Diagnosis not present

## 2016-08-06 DIAGNOSIS — R5383 Other fatigue: Secondary | ICD-10-CM | POA: Diagnosis not present

## 2016-08-06 DIAGNOSIS — S32050A Wedge compression fracture of fifth lumbar vertebra, initial encounter for closed fracture: Secondary | ICD-10-CM | POA: Diagnosis not present

## 2016-08-06 DIAGNOSIS — M810 Age-related osteoporosis without current pathological fracture: Secondary | ICD-10-CM | POA: Diagnosis not present

## 2016-08-06 DIAGNOSIS — S92334A Nondisplaced fracture of third metatarsal bone, right foot, initial encounter for closed fracture: Secondary | ICD-10-CM | POA: Diagnosis not present

## 2016-08-06 DIAGNOSIS — S92324A Nondisplaced fracture of second metatarsal bone, right foot, initial encounter for closed fracture: Secondary | ICD-10-CM | POA: Diagnosis not present

## 2016-08-07 ENCOUNTER — Ambulatory Visit: Payer: Medicare Other | Admitting: Sports Medicine

## 2016-08-11 DIAGNOSIS — M8589 Other specified disorders of bone density and structure, multiple sites: Secondary | ICD-10-CM | POA: Diagnosis not present

## 2016-08-25 ENCOUNTER — Encounter: Payer: Self-pay | Admitting: Sports Medicine

## 2016-08-25 ENCOUNTER — Ambulatory Visit (INDEPENDENT_AMBULATORY_CARE_PROVIDER_SITE_OTHER): Payer: Medicare Other | Admitting: Sports Medicine

## 2016-08-25 DIAGNOSIS — M79672 Pain in left foot: Secondary | ICD-10-CM

## 2016-08-25 DIAGNOSIS — L84 Corns and callosities: Secondary | ICD-10-CM | POA: Diagnosis not present

## 2016-08-25 DIAGNOSIS — Z7901 Long term (current) use of anticoagulants: Secondary | ICD-10-CM

## 2016-08-25 DIAGNOSIS — B351 Tinea unguium: Secondary | ICD-10-CM

## 2016-08-25 DIAGNOSIS — I739 Peripheral vascular disease, unspecified: Secondary | ICD-10-CM | POA: Diagnosis not present

## 2016-08-25 DIAGNOSIS — M79671 Pain in right foot: Secondary | ICD-10-CM

## 2016-08-25 NOTE — Progress Notes (Signed)
Subjective: Deborah Ball is a 83 y.o. female patient seen today in office with complaint of painful thickened and elongated toenails and corn on right 5th toe>bottom of right foot; unable to trim. Patient is assisted by Daughter at this visit and reports that since last vitis had eye issue, heart issue and fell down and broke bones saw doctor who states that right foot fractures are healing well.  There are no active problems to display for this patient.   Current Outpatient Prescriptions on File Prior to Visit  Medication Sig Dispense Refill  . diltiazem (CARTIA XT) 240 MG 24 hr capsule Take 240 mg by mouth.    . levothyroxine (SYNTHROID, LEVOTHROID) 50 MCG tablet Take by mouth.    . rivaroxaban (XARELTO) 20 MG TABS tablet Take by mouth.    . Vitamin D, Ergocalciferol, (DRISDOL) 50000 units CAPS capsule Take by mouth.    Marland Kitchen amiodarone (PACERONE) 400 MG tablet Take 400 mg by mouth.     No current facility-administered medications on file prior to visit.     Allergies  Allergen Reactions  . Aspirin     Other reaction(s): OTHER  . Ciprofloxacin     Other reaction(s): OTHER  . Penicillins   . Phenobarbital     Other reaction(s): UNKNOWN  . Statins     Other reaction(s): OTHER  . Sulfa Antibiotics     Other reaction(s): RASH    Objective: Physical Exam  General: Well developed, nourished, no acute distress, awake, alert and oriented x 3  Vascular: Dorsalis pedis artery 1/4 bilateral, Posterior tibial artery 1/4 bilateral, skin temperature warm to warm proximal to distal bilateral lower extremities, + varicosities, scant pedal hair present bilateral.  Neurological: Gross sensation present via light touch bilateral.   Dermatological: Skin is warm, dry, and supple bilateral, Nails 1-10 are tender, long, thick, and discolored with mild subungal debris, no webspace macerations present bilateral, no open lesions present bilateral, + hyperkeratotic tissue present right 5th toe and sub met  1 on right. + ecchymosis at dorsal right foot without pain. No signs of infection bilateral.  Musculoskeletal: No pain to right foot. Asymptomatic hammertoe boney deformities noted bilateral. Muscular strength within normal limits without pain on range of motion. No pain with calf compression bilateral.  Assessment and Plan:  Problem List Items Addressed This Visit    None    Visit Diagnoses    Dermatophytosis of nail    -  Primary   Corns and callosities       Foot pain, bilateral       PVD (peripheral vascular disease) ([place])       Current use of long term anticoagulation          -Examined patient.  -Discussed treatment options for painful mycotic nails. -Mechanically debrided callus x 2 using sterile chisel blade and reduced mycotic nails with sterile nail nipper and dremel nail file without incident. -Recommend stiff sole shoe for healing fracture on right foot; will xray at next visit if continues to be ecchymotic or painful to assess healing -Applied offloading padding right foot -Patient to return in 3 months for follow up evaluation or sooner if symptoms worsen.  Landis Martins, DPM

## 2016-08-28 DIAGNOSIS — Z853 Personal history of malignant neoplasm of breast: Secondary | ICD-10-CM | POA: Diagnosis not present

## 2016-08-28 DIAGNOSIS — N6321 Unspecified lump in the left breast, upper outer quadrant: Secondary | ICD-10-CM | POA: Diagnosis not present

## 2016-08-28 DIAGNOSIS — D4862 Neoplasm of uncertain behavior of left breast: Secondary | ICD-10-CM | POA: Diagnosis not present

## 2016-08-28 DIAGNOSIS — Z1389 Encounter for screening for other disorder: Secondary | ICD-10-CM | POA: Diagnosis not present

## 2016-08-28 DIAGNOSIS — D519 Vitamin B12 deficiency anemia, unspecified: Secondary | ICD-10-CM | POA: Diagnosis not present

## 2016-08-28 DIAGNOSIS — E039 Hypothyroidism, unspecified: Secondary | ICD-10-CM | POA: Diagnosis not present

## 2016-08-28 DIAGNOSIS — Z1231 Encounter for screening mammogram for malignant neoplasm of breast: Secondary | ICD-10-CM | POA: Diagnosis not present

## 2016-08-28 DIAGNOSIS — E785 Hyperlipidemia, unspecified: Secondary | ICD-10-CM | POA: Diagnosis not present

## 2016-08-28 DIAGNOSIS — I10 Essential (primary) hypertension: Secondary | ICD-10-CM | POA: Diagnosis not present

## 2016-08-28 DIAGNOSIS — Z1211 Encounter for screening for malignant neoplasm of colon: Secondary | ICD-10-CM | POA: Diagnosis not present

## 2016-08-28 DIAGNOSIS — Z0000 Encounter for general adult medical examination without abnormal findings: Secondary | ICD-10-CM | POA: Diagnosis not present

## 2016-08-28 DIAGNOSIS — Z79899 Other long term (current) drug therapy: Secondary | ICD-10-CM | POA: Diagnosis not present

## 2016-08-28 DIAGNOSIS — I4891 Unspecified atrial fibrillation: Secondary | ICD-10-CM | POA: Diagnosis not present

## 2016-08-28 DIAGNOSIS — Z6830 Body mass index (BMI) 30.0-30.9, adult: Secondary | ICD-10-CM | POA: Diagnosis not present

## 2016-08-28 DIAGNOSIS — D485 Neoplasm of uncertain behavior of skin: Secondary | ICD-10-CM | POA: Diagnosis not present

## 2016-08-28 DIAGNOSIS — N63 Unspecified lump in unspecified breast: Secondary | ICD-10-CM | POA: Diagnosis not present

## 2016-09-07 DIAGNOSIS — N6489 Other specified disorders of breast: Secondary | ICD-10-CM | POA: Diagnosis not present

## 2016-09-07 DIAGNOSIS — R928 Other abnormal and inconclusive findings on diagnostic imaging of breast: Secondary | ICD-10-CM | POA: Diagnosis not present

## 2016-09-07 DIAGNOSIS — Z853 Personal history of malignant neoplasm of breast: Secondary | ICD-10-CM | POA: Diagnosis not present

## 2016-09-07 DIAGNOSIS — N6321 Unspecified lump in the left breast, upper outer quadrant: Secondary | ICD-10-CM | POA: Diagnosis not present

## 2016-09-07 DIAGNOSIS — N63 Unspecified lump in unspecified breast: Secondary | ICD-10-CM | POA: Diagnosis not present

## 2016-09-07 DIAGNOSIS — D4862 Neoplasm of uncertain behavior of left breast: Secondary | ICD-10-CM | POA: Diagnosis not present

## 2016-09-15 DIAGNOSIS — M25551 Pain in right hip: Secondary | ICD-10-CM | POA: Diagnosis not present

## 2016-09-16 DIAGNOSIS — Z7902 Long term (current) use of antithrombotics/antiplatelets: Secondary | ICD-10-CM | POA: Diagnosis not present

## 2016-09-16 DIAGNOSIS — S32501A Unspecified fracture of right pubis, initial encounter for closed fracture: Secondary | ICD-10-CM | POA: Diagnosis not present

## 2016-09-16 DIAGNOSIS — S329XXA Fracture of unspecified parts of lumbosacral spine and pelvis, initial encounter for closed fracture: Secondary | ICD-10-CM | POA: Diagnosis not present

## 2016-09-17 ENCOUNTER — Other Ambulatory Visit (HOSPITAL_COMMUNITY): Payer: Self-pay | Admitting: Sports Medicine

## 2016-09-17 DIAGNOSIS — M810 Age-related osteoporosis without current pathological fracture: Secondary | ICD-10-CM | POA: Diagnosis not present

## 2016-09-17 DIAGNOSIS — T148XXA Other injury of unspecified body region, initial encounter: Secondary | ICD-10-CM

## 2016-09-17 DIAGNOSIS — S32050D Wedge compression fracture of fifth lumbar vertebra, subsequent encounter for fracture with routine healing: Secondary | ICD-10-CM | POA: Diagnosis not present

## 2016-09-21 DIAGNOSIS — Z9181 History of falling: Secondary | ICD-10-CM | POA: Diagnosis not present

## 2016-09-21 DIAGNOSIS — I480 Paroxysmal atrial fibrillation: Secondary | ICD-10-CM | POA: Diagnosis not present

## 2016-09-21 DIAGNOSIS — I10 Essential (primary) hypertension: Secondary | ICD-10-CM | POA: Diagnosis not present

## 2016-09-21 DIAGNOSIS — W19XXXD Unspecified fall, subsequent encounter: Secondary | ICD-10-CM | POA: Diagnosis not present

## 2016-09-21 DIAGNOSIS — S32591D Other specified fracture of right pubis, subsequent encounter for fracture with routine healing: Secondary | ICD-10-CM | POA: Diagnosis not present

## 2016-09-21 DIAGNOSIS — S7001XD Contusion of right hip, subsequent encounter: Secondary | ICD-10-CM | POA: Diagnosis not present

## 2016-09-22 ENCOUNTER — Other Ambulatory Visit (HOSPITAL_COMMUNITY): Payer: Self-pay | Admitting: Sports Medicine

## 2016-09-22 ENCOUNTER — Ambulatory Visit (HOSPITAL_COMMUNITY)
Admission: RE | Admit: 2016-09-22 | Discharge: 2016-09-22 | Disposition: A | Discharge status: Home / Self Care | Payer: Medicare Other | Source: Ambulatory Visit | Attending: Sports Medicine | Admitting: Sports Medicine

## 2016-09-22 DIAGNOSIS — X58XXXA Exposure to other specified factors, initial encounter: Secondary | ICD-10-CM | POA: Diagnosis not present

## 2016-09-22 DIAGNOSIS — T148XXA Other injury of unspecified body region, initial encounter: Secondary | ICD-10-CM

## 2016-09-22 DIAGNOSIS — S8011XA Contusion of right lower leg, initial encounter: Secondary | ICD-10-CM | POA: Diagnosis not present

## 2016-09-23 ENCOUNTER — Emergency Department (HOSPITAL_COMMUNITY)
Admission: EM | Admit: 2016-09-23 | Discharge: 2016-09-23 | Disposition: A | Discharge status: Home / Self Care | Payer: Medicare Other | Attending: Emergency Medicine | Admitting: Emergency Medicine

## 2016-09-23 ENCOUNTER — Other Ambulatory Visit (HOSPITAL_COMMUNITY): Payer: Self-pay | Admitting: Sports Medicine

## 2016-09-23 ENCOUNTER — Emergency Department (HOSPITAL_COMMUNITY): Payer: Medicare Other

## 2016-09-23 ENCOUNTER — Encounter (HOSPITAL_COMMUNITY): Payer: Self-pay | Admitting: Emergency Medicine

## 2016-09-23 DIAGNOSIS — S299XXA Unspecified injury of thorax, initial encounter: Secondary | ICD-10-CM | POA: Diagnosis not present

## 2016-09-23 DIAGNOSIS — T148XXA Other injury of unspecified body region, initial encounter: Secondary | ICD-10-CM | POA: Diagnosis not present

## 2016-09-23 DIAGNOSIS — Y998 Other external cause status: Secondary | ICD-10-CM | POA: Diagnosis not present

## 2016-09-23 DIAGNOSIS — S32511A Fracture of superior rim of right pubis, initial encounter for closed fracture: Secondary | ICD-10-CM

## 2016-09-23 DIAGNOSIS — S32501A Unspecified fracture of right pubis, initial encounter for closed fracture: Secondary | ICD-10-CM | POA: Diagnosis not present

## 2016-09-23 DIAGNOSIS — Z9181 History of falling: Secondary | ICD-10-CM | POA: Diagnosis not present

## 2016-09-23 DIAGNOSIS — W19XXXD Unspecified fall, subsequent encounter: Secondary | ICD-10-CM | POA: Diagnosis not present

## 2016-09-23 DIAGNOSIS — S79911A Unspecified injury of right hip, initial encounter: Secondary | ICD-10-CM | POA: Diagnosis not present

## 2016-09-23 DIAGNOSIS — S32591D Other specified fracture of right pubis, subsequent encounter for fracture with routine healing: Secondary | ICD-10-CM | POA: Diagnosis not present

## 2016-09-23 DIAGNOSIS — R000 Tachycardia, unspecified: Secondary | ICD-10-CM | POA: Diagnosis not present

## 2016-09-23 DIAGNOSIS — Z79899 Other long term (current) drug therapy: Secondary | ICD-10-CM | POA: Insufficient documentation

## 2016-09-23 DIAGNOSIS — S7001XD Contusion of right hip, subsequent encounter: Secondary | ICD-10-CM | POA: Diagnosis not present

## 2016-09-23 DIAGNOSIS — M25551 Pain in right hip: Secondary | ICD-10-CM | POA: Diagnosis not present

## 2016-09-23 DIAGNOSIS — I480 Paroxysmal atrial fibrillation: Secondary | ICD-10-CM | POA: Diagnosis not present

## 2016-09-23 DIAGNOSIS — Y939 Activity, unspecified: Secondary | ICD-10-CM | POA: Insufficient documentation

## 2016-09-23 DIAGNOSIS — Y929 Unspecified place or not applicable: Secondary | ICD-10-CM | POA: Diagnosis not present

## 2016-09-23 DIAGNOSIS — I4891 Unspecified atrial fibrillation: Secondary | ICD-10-CM | POA: Diagnosis not present

## 2016-09-23 DIAGNOSIS — I10 Essential (primary) hypertension: Secondary | ICD-10-CM | POA: Diagnosis not present

## 2016-09-23 DIAGNOSIS — W19XXXA Unspecified fall, initial encounter: Secondary | ICD-10-CM | POA: Insufficient documentation

## 2016-09-23 LAB — URINALYSIS, ROUTINE W REFLEX MICROSCOPIC
Bilirubin Urine: NEGATIVE
Glucose, UA: NEGATIVE mg/dL
HGB URINE DIPSTICK: NEGATIVE
Ketones, ur: NEGATIVE mg/dL
Nitrite: NEGATIVE
PROTEIN: NEGATIVE mg/dL
SPECIFIC GRAVITY, URINE: 1.01 (ref 1.005–1.030)
pH: 6 (ref 5.0–8.0)

## 2016-09-23 LAB — CBC
HEMATOCRIT: 36 % (ref 36.0–46.0)
Hemoglobin: 11.6 g/dL — ABNORMAL LOW (ref 12.0–15.0)
MCH: 32.8 pg (ref 26.0–34.0)
MCHC: 32.2 g/dL (ref 30.0–36.0)
MCV: 101.7 fL — AB (ref 78.0–100.0)
Platelets: 385 10*3/uL (ref 150–400)
RBC: 3.54 MIL/uL — ABNORMAL LOW (ref 3.87–5.11)
RDW: 16 % — ABNORMAL HIGH (ref 11.5–15.5)
WBC: 6.4 10*3/uL (ref 4.0–10.5)

## 2016-09-23 LAB — BASIC METABOLIC PANEL
Anion gap: 7 (ref 5–15)
BUN: 14 mg/dL (ref 6–20)
CHLORIDE: 108 mmol/L (ref 101–111)
CO2: 25 mmol/L (ref 22–32)
Calcium: 8.5 mg/dL — ABNORMAL LOW (ref 8.9–10.3)
Creatinine, Ser: 0.78 mg/dL (ref 0.44–1.00)
GFR calc non Af Amer: >60 mL/min (ref >60)
Glucose, Bld: 104 mg/dL — ABNORMAL HIGH (ref 65–99)
POTASSIUM: 4.1 mmol/L (ref 3.5–5.1)
SODIUM: 140 mmol/L (ref 135–145)

## 2016-09-23 LAB — HEPATIC FUNCTION PANEL
ALBUMIN: 3 g/dL — AB (ref 3.5–5.0)
ALT: 27 U/L (ref 14–54)
AST: 33 U/L (ref 15–41)
Alkaline Phosphatase: 77 U/L (ref 38–126)
Bilirubin, Direct: 0.2 mg/dL (ref 0.1–0.5)
Indirect Bilirubin: 1 mg/dL — ABNORMAL HIGH (ref 0.3–0.9)
TOTAL PROTEIN: 6.3 g/dL — AB (ref 6.5–8.1)
Total Bilirubin: 1.2 mg/dL (ref 0.3–1.2)

## 2016-09-23 NOTE — Discharge Instructions (Signed)
Your have a fracture of the right symphysis pubis that is stable.  The blood work and work-up has been reassuring. Our case manager will arrange more home health for you, starting tomorrow, including nursing, health aide, social work on top of the PT/OT that you already have The home social worker will also assist in helping you get placed to rehab facility from home. Return for worsening symptoms, including escalating pain, confusion, fever, worsening swelling, or any other symptoms concerning to you.

## 2016-09-23 NOTE — Progress Notes (Signed)
PT Cancellation Note  Patient Details Name: Deborah Ball MRN: [id number] DOB: 1922/05/22   Cancelled Treatment:    Reason Eval/Treat Not Completed: PT screened, no needs identified, will sign off Per RN, pt d/c'd back home and no further skilled PT needs. Will sign off. Please re-consult if needs change.   Leighton Ruff, PT, DPT  Acute Rehabilitation Services  Pager: [phone]    Rudean Hitt 09/23/2016, 5:37 PM

## 2016-09-23 NOTE — ED Notes (Signed)
Case management at bedside.

## 2016-09-23 NOTE — Care Management (Signed)
ED CM met with patient prior to discharge patient and family and provided a Private Duty List, denies any additional ED CM needs.

## 2016-09-23 NOTE — Progress Notes (Signed)
CSW spoke with pt and pt's family at bedside. Pt is understanding that insurance may be a barrier in getting SNF placement. Per RN CM pt is set up with [facility] and CSW provided pt with a list of SNF in [facility] if pt is wanting pay privately for services.     Deborah Ball, MSW, [facility] Clinical Social Worker [phone]

## 2016-09-23 NOTE — Discharge Planning (Signed)
EDCM spoke with pt and daughter at bedside regarding discharge planning. Pt from home alone and on weight bearing restriction after recent fall that resulted in pelvic fracture.  Pt was totally independent prior to fall whic left her NWB per Dr Layne Benton.    Pt will likely have to return home with additional home health services.  [facility] rep aware of discharge.

## 2016-09-23 NOTE — ED Notes (Signed)
Patient still in radiology.

## 2016-09-23 NOTE — ED Triage Notes (Addendum)
Arrived via EMS from patient's home. Patient fell one week ago and had right hip pain with ecchymosis.  PT was at the home today and patient having increased right hip pain and general fatigue.  Alert answering and following commands appropriate.  PT stated HR 120 EMS reported EKG ST 121

## 2016-09-23 NOTE — ED Notes (Signed)
Pt stable, states understanding of discharge instructions, family at bedside 

## 2016-09-23 NOTE — ED Provider Notes (Signed)
[facility] DEPT Provider Note   CSN: [id number] Arrival date & time: 09/23/16  1218     History   Chief Complaint Chief Complaint  Patient presents with  . Hip Pain  . Fatigue    HPI Deborah Ball is a 83 y.o. female.  The history is provided by the patient.  Illness  This is a new problem. The current episode started more than 1 week ago. The problem occurs constantly. The problem has not changed since onset.Pertinent negatives include no chest pain, no abdominal pain, no headaches and no shortness of breath. The symptoms are aggravated by twisting. The symptoms are relieved by lying down. She has tried nothing for the symptoms. The treatment provided no relief.     83 year old female who presents with fatigue and right hip pain. History of PAF on Xarelto, HTN. Had mechanical fall without headstrike 1 week ago. Seen at [facility] 1-2 days later and diagnosed with right symphysis pubis fracture. Discharged home and seen by Dr. Layne Benton, her orthopedic surgeon who ordered her home health. She is NWB in her right lower extremities. Even with PT, family unable to take care of her as she is primarily bedbound. She states when she tries to get up to transfer to use the bathroom, she feels exhausted and fatigued. No cough, fever, dyspnea, chest pain, leg swelling, dysuria, urinary frequency, abdominal pain, nausea, vomiting or diarrhea. Take small amounts of hydrocodone with some improvement in pain.  Past Medical History:  Diagnosis Date  . A-fib ([place])   . Cancer ([place])   . Hypertension   . Thyroid disease     There are no active problems to display for this patient.   Past Surgical History:  Procedure Laterality Date  . ABDOMINAL HYSTERECTOMY    . ABDOMINAL SURGERY    . APPENDECTOMY    . BREAST SURGERY    . CHOLECYSTECTOMY      OB History    No data available       Home Medications    Prior to Admission medications   Medication Sig Start Date End Date Taking?  Authorizing Provider  acetaminophen (TYLENOL) 500 MG tablet Take 500 mg by mouth at bedtime as needed.   Yes [provider]  acetaminophen (TYLENOL) 650 MG CR tablet Take 650 mg by mouth every 8 (eight) hours as needed for pain.   Yes [provider]  amiodarone (PACERONE) 200 MG tablet Take 200 mg by mouth daily.   Yes [provider]  B Complex-C (B-COMPLEX WITH VITAMIN C) tablet Take 1 tablet by mouth daily. *cuts tablet in half to take the whole tablet total   Yes [provider]  diltiazem (CARTIA XT) 240 MG 24 hr capsule Take 240 mg by mouth daily.  06/25/15  Yes [provider]  HYDROcodone-acetaminophen (NORCO/VICODIN) 5-325 MG tablet Take 0.5-1 tablets by mouth at bedtime as needed for moderate pain.   Yes [provider]  levothyroxine (SYNTHROID, LEVOTHROID) 50 MCG tablet Take 50 mcg by mouth daily before breakfast.  03/24/10  Yes [provider]  mupirocin ointment (BACTROBAN) 2 % Place 1 application into the nose daily as needed (for skin wounds).   Yes [provider]  Propylene Glycol (SYSTANE BALANCE) 0.6 % SOLN Place 1 drop into both eyes daily as needed (for dry eyes).   Yes [provider]  rivaroxaban (XARELTO) 20 MG TABS tablet Take 20 mg by mouth daily with supper.  06/01/14  Yes [provider]  vitamin B-12 (CYANOCOBALAMIN) 1000 MCG tablet Take 1,000 mcg by mouth daily. *Cuts tablet in half to take the whole tablet total   Yes [provider]  Vitamin D, Ergocalciferol, (DRISDOL) 50000 units CAPS capsule Take 50,000 Units by mouth every 7 (seven) days. friday   Yes [provider]  amiodarone (PACERONE) 400 MG tablet Take 400 mg by mouth. 04/20/16 05/20/16  [provider]    Family History No family history on file.  Social History Social History  Substance Use Topics  . Smoking status: Never Smoker  . Smokeless tobacco: Never Used  . Alcohol use No      Allergies   Aspirin; Ciprofloxacin; Food; Iodinated diagnostic agents; Penicillins; Phenobarbital; Statins; and Sulfa antibiotics   Review of Systems Review of Systems  Constitutional: Negative for fever.  Respiratory: Negative for shortness of breath.   Cardiovascular: Negative for chest pain.  Gastrointestinal: Negative for abdominal pain.  Neurological: Negative for headaches.  All other systems reviewed and are negative.    Physical Exam Updated Vital Signs BP (!) 115/51   Pulse 76   Temp 98.1 F (36.7 C) (Oral)   Resp 15   Ht 5\' 3"  (1.6 m)   Wt 79.4 kg (175 lb)   SpO2 93%   BMI 31.00 kg/m   Physical Exam Physical Exam  Nursing note and vitals reviewed. Constitutional: Well developed, well nourished, non-toxic, and in no acute distress Head: Normocephalic and atraumatic.  Mouth/Throat: Oropharynx is clear and moist.  Neck: Normal range of motion. Neck supple.  Cardiovascular: Normal rate and regular rhythm.  +2 DP pulses bilaterally Pulmonary/Chest: Effort normal and breath sounds normal.  Abdominal: Soft. There is no tenderness. There is no rebound and no guarding.  Musculoskeletal: Limited ROM of the right lower extremity. Palpable soft tissue swelling (c/w hematoma) over the right lateral hip. Compartments soft.   Neurological: Alert, no facial droop, fluent speech, sensation to light touch in tact throughout Skin: Skin is warm and dry.  Psychiatric: Cooperative   ED Treatments / Results  Labs (all labs ordered are listed, but only abnormal results are displayed) Labs Reviewed  BASIC METABOLIC PANEL - Abnormal; Notable for the following:       Result Value   Glucose, Bld 104 (*)    Calcium 8.5 (*)    All other components within normal limits  CBC - Abnormal; Notable for the following:    RBC 3.54 (*)    Hemoglobin 11.6 (*)    MCV 101.7 (*)    RDW 16.0 (*)    All other components within normal limits  URINALYSIS, ROUTINE W REFLEX MICROSCOPIC -  Abnormal; Notable for the following:    Leukocytes, UA TRACE (*)    Bacteria, UA MANY (*)    Squamous Epithelial / LPF 0-5 (*)    All other components within normal limits  HEPATIC FUNCTION PANEL - Abnormal; Notable for the following:    Total Protein 6.3 (*)    Albumin 3.0 (*)    Indirect Bilirubin 1.0 (*)    All other components within normal limits  URINE CULTURE    EKG  EKG Interpretation  Date/Time:  Wednesday September 23 2016 12:30:28 EDT Ventricular Rate:  110 PR Interval:    QRS Duration: 98 QT Interval:  337 QTC Calculation: 456 R Axis:   58 Text Interpretation:  Sinus or ectopic atrial tachycardia no prior EKG  Confirmed by Brantley Stage [phone]) on 09/23/2016 2:20:02 PM  Radiology Dg Chest 2 View  Result Date: 09/23/2016 CLINICAL DATA:  Arrived via EMS from patient's home. Patient fell one week ago inside her daughter's home while walking with her walker and had right hip pain with ecchymosis. Pt was at the home today and patient having increased right hip pain. EXAM: CHEST  2 VIEW COMPARISON:  None. FINDINGS: Heart is mildly enlarged. The aorta is partially calcified and tortuous. There are no focal consolidations or pleural effusions. No pulmonary edema. No evidence for pneumothorax or acute displaced fractures. Chronic changes are seen in both shoulders. Degenerative changes are seen in thoracic spine. IMPRESSION: 1. Mild cardiomegaly. 2.  No evidence for acute pulmonary abnormality. 3.  Aortic atherosclerosis.  (ICD10-I70.0) tortuous aorta. Electronically Signed   By: Nolon Nations M.D.   On: 09/23/2016 14:20   [place] Rt Lower Extrem Ltd Soft Tissue Non Vascular  Result Date: 09/22/2016 CLINICAL DATA:  Hematoma RIGHT lower extremity for 1 week, pelvic fracture a 1 week ago EXAM: ULTRASOUND RIGHT LOWER EXTREMITY SOFT TISSUE LIMITED TECHNIQUE: Ultrasound examination of the lower extremity soft tissues was performed in the area of clinical concern at the RIGHT hip.  COMPARISON:  None FINDINGS: Sonography was performed at the site of clinical concern in the RIGHT lower extremity at the RIGHT hip region. At this site, a complex heterogeneous isoechoic to hypoechoic subcutaneous subcutaneous mass lesion is identified measuring 8.8 x 3.9 x 5.6 cm. No internal blood flow on color Doppler imaging. Finding is most consistent with a subcutaneous hematoma. IMPRESSION: 8.8 x 3.9 x 5.6 cm diameter heterogeneous isoechoic to hypoechoic collection at the site of clinical concern at the RIGHT hip region most consistent with a subcutaneous hematoma. Clinical management/followup until resolution recommended to exclude underlying abnormalities. Electronically Signed   By: Lavonia Josy Peaden M.D.   On: 09/22/2016 17:33   Dg Hip Unilat W Or Wo Pelvis 2-3 Views Right  Result Date: 09/23/2016 CLINICAL DATA:  Arrived via EMS from patient's home. Patient fell one week ago inside her daughter's home while walking with her walker and had right hip pain with ecchymosis. Pt was at the home today and patient having increased right hip pain.*comment was truncated* EXAM: DG HIP (WITH OR WITHOUT PELVIS) 2-3V RIGHT COMPARISON:  10/31/2015 FINDINGS: Patient has had ORIF of the right femur. There is heterotopic bone surrounding the ORIF. Visualized portion of the right femur appears intact. There are degenerative changes in both hips and lumbar spine. There is a comminuted fracture of the right symphysis pubis. Portions of the pelvis are obscured by overlying bowel gas. IMPRESSION: Comminuted fracture of the right symphysis pubis. Remote ORIF of the right femur. Degenerative changes. Electronically Signed   By: Nolon Nations M.D.   On: 09/23/2016 14:25    Procedures Procedures (including critical care time)  Medications Ordered in ED Medications - No data to display   Initial Impression / Assessment and Plan / ED Course  I have reviewed the triage vital signs and the nursing notes.  Pertinent labs  & imaging results that were available during my care of the patient were reviewed by me and considered in my medical decision making (see chart for details).     In process of obtaining records from [facility] as patient with CT hip, Xrays, and lab work.   She is well-appearing in no acute distress. Vital signs are within normal limits. No major electrolyte or metabolic derangements. No significant infection. UA with bacterial and 6-30 WBCs, but she is not having  any symptoms of UTI. Did send for culture.  Mild anemia of 11.6. She does have hematoma over the right hip, which family reports is stable. Has had some increased bruising. Compartments are soft.  Repeat x-ray does show right symphysis pubis fracture.  Discussed the case management as patient felt likely to require skilled nursing facility/rehabilitation. Does not meet criteria for admission to hospital. CM and SW unable to gain authorization to admit patient for placement or placement through ED. Discussed with family, who felt additional home health resources adequate at this time, while they arrange for rehab placement from home. Strict return and follow-up instructions reviewed. Patient and family expressed understanding of all discharge instructions and felt comfortable with the plan of care.   Final Clinical Impressions(s) / ED Diagnoses   Final diagnoses:  Closed fracture of superior ramus of right pubis, initial encounter [facility])    New Prescriptions New Prescriptions   No medications on file     Forde Dandy, MD 09/23/16 1630

## 2016-09-24 DIAGNOSIS — W19XXXD Unspecified fall, subsequent encounter: Secondary | ICD-10-CM | POA: Diagnosis not present

## 2016-09-24 DIAGNOSIS — Z9181 History of falling: Secondary | ICD-10-CM | POA: Diagnosis not present

## 2016-09-24 DIAGNOSIS — I10 Essential (primary) hypertension: Secondary | ICD-10-CM | POA: Diagnosis not present

## 2016-09-24 DIAGNOSIS — S7001XD Contusion of right hip, subsequent encounter: Secondary | ICD-10-CM | POA: Diagnosis not present

## 2016-09-24 DIAGNOSIS — I480 Paroxysmal atrial fibrillation: Secondary | ICD-10-CM | POA: Diagnosis not present

## 2016-09-24 DIAGNOSIS — S32591D Other specified fracture of right pubis, subsequent encounter for fracture with routine healing: Secondary | ICD-10-CM | POA: Diagnosis not present

## 2016-09-25 DIAGNOSIS — Z9181 History of falling: Secondary | ICD-10-CM | POA: Diagnosis not present

## 2016-09-25 DIAGNOSIS — S32591D Other specified fracture of right pubis, subsequent encounter for fracture with routine healing: Secondary | ICD-10-CM | POA: Diagnosis not present

## 2016-09-25 DIAGNOSIS — I10 Essential (primary) hypertension: Secondary | ICD-10-CM | POA: Diagnosis not present

## 2016-09-25 DIAGNOSIS — S7001XD Contusion of right hip, subsequent encounter: Secondary | ICD-10-CM | POA: Diagnosis not present

## 2016-09-25 DIAGNOSIS — I480 Paroxysmal atrial fibrillation: Secondary | ICD-10-CM | POA: Diagnosis not present

## 2016-09-25 DIAGNOSIS — W19XXXD Unspecified fall, subsequent encounter: Secondary | ICD-10-CM | POA: Diagnosis not present

## 2016-09-26 ENCOUNTER — Emergency Department (HOSPITAL_COMMUNITY)
Admission: EM | Admit: 2016-09-26 | Discharge: 2016-09-26 | Disposition: A | Discharge status: Home / Self Care | Payer: Medicare Other | Attending: Emergency Medicine | Admitting: Emergency Medicine

## 2016-09-26 ENCOUNTER — Encounter (HOSPITAL_COMMUNITY): Payer: Self-pay | Admitting: Emergency Medicine

## 2016-09-26 DIAGNOSIS — I10 Essential (primary) hypertension: Secondary | ICD-10-CM | POA: Diagnosis not present

## 2016-09-26 DIAGNOSIS — Z7901 Long term (current) use of anticoagulants: Secondary | ICD-10-CM | POA: Diagnosis not present

## 2016-09-26 DIAGNOSIS — W1830XD Fall on same level, unspecified, subsequent encounter: Secondary | ICD-10-CM | POA: Diagnosis not present

## 2016-09-26 DIAGNOSIS — S7011XD Contusion of right thigh, subsequent encounter: Secondary | ICD-10-CM | POA: Insufficient documentation

## 2016-09-26 DIAGNOSIS — S79821D Other specified injuries of right thigh, subsequent encounter: Secondary | ICD-10-CM | POA: Diagnosis present

## 2016-09-26 LAB — URINE CULTURE: Culture: >=100000 — AB

## 2016-09-26 LAB — COMPREHENSIVE METABOLIC PANEL
ALBUMIN: 3.1 g/dL — AB (ref 3.5–5.0)
ALK PHOS: 103 U/L (ref 38–126)
ALT: 29 U/L (ref 14–54)
ANION GAP: 8 (ref 5–15)
AST: 34 U/L (ref 15–41)
BUN: 15 mg/dL (ref 6–20)
CO2: 26 mmol/L (ref 22–32)
Calcium: 8.6 mg/dL — ABNORMAL LOW (ref 8.9–10.3)
Chloride: 108 mmol/L (ref 101–111)
Creatinine, Ser: 0.9 mg/dL (ref 0.44–1.00)
GFR calc Af Amer: >60 mL/min (ref >60)
GFR calc non Af Amer: 54 mL/min — ABNORMAL LOW (ref >60)
GLUCOSE: 126 mg/dL — AB (ref 65–99)
Potassium: 4.1 mmol/L (ref 3.5–5.1)
SODIUM: 142 mmol/L (ref 135–145)
Total Bilirubin: 0.7 mg/dL (ref 0.3–1.2)
Total Protein: 6.5 g/dL (ref 6.5–8.1)

## 2016-09-26 LAB — CBC WITH DIFFERENTIAL/PLATELET
Basophils Absolute: 0.1 10*3/uL (ref 0.0–0.1)
Basophils Relative: 1 %
EOS ABS: 0.3 10*3/uL (ref 0.0–0.7)
Eosinophils Relative: 3 %
HCT: 36.2 % (ref 36.0–46.0)
HEMOGLOBIN: 11.5 g/dL — AB (ref 12.0–15.0)
LYMPHS ABS: 1.9 10*3/uL (ref 0.7–4.0)
LYMPHS PCT: 25 %
MCH: 32.4 pg (ref 26.0–34.0)
MCHC: 31.8 g/dL (ref 30.0–36.0)
MCV: 102 fL — ABNORMAL HIGH (ref 78.0–100.0)
MONOS PCT: 12 %
Monocytes Absolute: 0.9 10*3/uL (ref 0.1–1.0)
NEUTROS PCT: 59 %
Neutro Abs: 4.6 10*3/uL (ref 1.7–7.7)
Platelets: 435 10*3/uL — ABNORMAL HIGH (ref 150–400)
RBC: 3.55 MIL/uL — AB (ref 3.87–5.11)
RDW: 15.9 % — ABNORMAL HIGH (ref 11.5–15.5)
WBC: 7.6 10*3/uL (ref 4.0–10.5)

## 2016-09-26 LAB — PROTIME-INR
INR: 1.4
Prothrombin Time: 17 seconds — ABNORMAL HIGH (ref 11.4–15.2)

## 2016-09-26 NOTE — ED Provider Notes (Signed)
[place] DEPT Provider Note   CSN: [id number] Arrival date & time: 09/26/16  2013     History   Chief Complaint Chief Complaint  Patient presents with  . Fall    Taking Xarelto  . Right Hip Pain/Bruise    HPI Deborah Ball is a 83 y.o. female presenting with right leg contusion.  Patient states that she fell on August 21. She was found to have a pelvic symphysis fracture. Since then, she has been nonweightbearing. Patient reports significant bruising of the right thigh at that time. She noticed over the past 2 days some spreading bruising down her right leg and some increased right foot swelling. She denies any pain. He has been evaluated by orthopedics, who is planning on doing a procedure on a hematoma of her upper right thigh (on 09/04). She denies reinjury or fall. She is on blood thinners. She denies chest pain or shortness of breath. She denies numbness or tingling. She has been sleeping in her recliner and spending most of her day in the recliner.  HPI  Past Medical History:  Diagnosis Date  . A-fib ([place])   . Cancer ([place])   . Hypertension   . Thyroid disease     There are no active problems to display for this patient.   Past Surgical History:  Procedure Laterality Date  . ABDOMINAL HYSTERECTOMY    . ABDOMINAL SURGERY    . APPENDECTOMY    . BREAST SURGERY    . CHOLECYSTECTOMY      OB History    No data available       Home Medications    Prior to Admission medications   Medication Sig Start Date End Date Taking? Authorizing Provider  acetaminophen (TYLENOL) 500 MG tablet Take 500 mg by mouth at bedtime as needed.    [provider]  acetaminophen (TYLENOL) 650 MG CR tablet Take 650 mg by mouth every 8 (eight) hours as needed for pain.    [provider]  amiodarone (PACERONE) 200 MG tablet Take 200 mg by mouth daily.    [provider]  amiodarone (PACERONE) 400 MG tablet Take 400 mg by mouth. 04/20/16 05/20/16  [provider]  B Complex-C (B-COMPLEX WITH VITAMIN C) tablet Take 1 tablet by mouth daily. *cuts tablet in half to take the whole tablet total    [provider]  diltiazem (CARTIA XT) 240 MG 24 hr capsule Take 240 mg by mouth daily.  06/25/15   [provider]  HYDROcodone-acetaminophen (NORCO/VICODIN) 5-325 MG tablet Take 0.5-1 tablets by mouth at bedtime as needed for moderate pain.    [provider]  levothyroxine (SYNTHROID, LEVOTHROID) 50 MCG tablet Take 50 mcg by mouth daily before breakfast.  03/24/10   [provider]  mupirocin ointment (BACTROBAN) 2 % Place 1 application into the nose daily as needed (for skin wounds).    [provider]  Propylene Glycol (SYSTANE BALANCE) 0.6 % SOLN Place 1 drop into both eyes daily as needed (for dry eyes).    [provider]  rivaroxaban (XARELTO) 20 MG TABS tablet Take 20 mg by mouth daily with supper.  06/01/14   [provider]  vitamin B-12 (CYANOCOBALAMIN) 1000 MCG tablet Take 1,000 mcg by mouth daily. *Cuts tablet in half to take the whole tablet total    [provider]  Vitamin D, Ergocalciferol, (DRISDOL) 50000 units CAPS capsule Take 50,000 Units by mouth every 7 (seven) days. friday    [provider]    Family History No family history on file.  Social History Social History  Substance Use Topics  . Smoking status: Never Smoker  . Smokeless tobacco: Never Used  . Alcohol use No     Allergies   Aspirin; Ciprofloxacin; Food; Iodinated diagnostic agents; Penicillins; Phenobarbital; Statins; and Sulfa antibiotics   Review of Systems Review of Systems  Musculoskeletal: Negative for arthralgias and myalgias.  Skin: Positive for color change.  Neurological: Negative for numbness.  Hematological: Bruises/bleeds easily.     Physical Exam Updated Vital Signs BP (!) 124/53   Pulse 65   Temp 98.2 F (36.8 C) (Oral)   Resp 17   SpO2 98%   Physical  Exam  Constitutional: She is oriented to person, place, and time. She appears well-developed and well-nourished. No distress.  HENT:  Head: Normocephalic and atraumatic.  Eyes: EOM are normal.  Neck: Normal range of motion.  Cardiovascular: Normal rate, regular rhythm and intact distal pulses.   Pulmonary/Chest: Effort normal and breath sounds normal. No respiratory distress. She has no wheezes.  Abdominal: Soft. She exhibits no distension. There is no tenderness.  Musculoskeletal: Normal range of motion.  Bruising of the lateral right upper thigh with extension down the lateral lower leg. Minimal edema of the right foot, improved while patient resting in the bed. No pitting edema. Pedal pulses equal bilaterally. Full range of motion of ankles and toes without pain. Sensation intact bilaterally. Strength of ankles against resistance equal bilaterally. Warmth equal bilaterally. Compartments soft.  Neurological: She is alert and oriented to person, place, and time.  Skin: Skin is warm. No rash noted.  Psychiatric: She has a normal mood and affect.  Nursing note and vitals reviewed.    ED Treatments / Results  Labs (all labs ordered are listed, but only abnormal results are displayed) Labs Reviewed  CBC WITH DIFFERENTIAL/PLATELET - Abnormal; Notable for the following:       Result Value   RBC 3.55 (*)    Hemoglobin 11.5 (*)    MCV 102.0 (*)    RDW 15.9 (*)    Platelets 435 (*)    All other components within normal limits  COMPREHENSIVE METABOLIC PANEL - Abnormal; Notable for the following:    Glucose, Bld 126 (*)    Calcium 8.6 (*)    Albumin 3.1 (*)    GFR calc non Af Amer 54 (*)    All other components within normal limits  PROTIME-INR - Abnormal; Notable for the following:    Prothrombin Time 17.0 (*)    All other components within normal limits    EKG  EKG Interpretation None       Radiology No results found.  Procedures Procedures (including critical care  time)  Medications Ordered in ED Medications - No data to display   Initial Impression / Assessment and Plan / ED Course  I have reviewed the triage vital signs and the nursing notes.  Pertinent labs & imaging results that were available during my care of the patient were reviewed by me and considered in my medical decision making (see chart for details).     She presenting with spreading contusion down her right leg. She is on Xarelto and had a fall on August 21. She has had no falls since then. She denies any pain. Physical exam shows minor bruising and minimal swelling of the right lower leg. Strength intact. Neurovascularly intact with soft compartments. Discussed case with attending, Dr. Jeanell Sparrow evaluated the  patient. Discussed findings with patient and her daughter. Patient to treat conservatively with elevation and compression stockings if needed. Patient to follow-up with orthopedics as scheduled. At this time, patient appears safe for discharge. Return precautions given. Patient states she understands and agrees to plan.  Final Clinical Impressions(s) / ED Diagnoses   Final diagnoses:  Contusion of right thigh, subsequent encounter    New Prescriptions New Prescriptions   No medications on file     Franchot Heidelberg, Hershal Coria 09/26/16 2152    Pattricia Boss, MD 09/26/16 2222

## 2016-09-26 NOTE — ED Triage Notes (Signed)
Patient fell last Aug. 21 , 2018 reports persistent pain at right hip with increasing bruise at right hip/right thigh and right lower leg swelling . She is taking Xarelto for Afib.

## 2016-09-26 NOTE — Discharge Instructions (Signed)
Try to prevent your legs from dangling, keeping your legs at the level of your heart as often as possible.  You may use compression socks as needed to help with swelling. You may have some continuation of movement of the bruise down your leg. Follow-up with your orthopedic doctor as scheduled. Return to the emergency room if you develop fever, chills, inability to move your foot, increased firmness and pain of your leg, or any new or worsening symptoms.

## 2016-09-27 ENCOUNTER — Telehealth: Payer: Self-pay

## 2016-09-27 DIAGNOSIS — S32591D Other specified fracture of right pubis, subsequent encounter for fracture with routine healing: Secondary | ICD-10-CM | POA: Diagnosis not present

## 2016-09-27 DIAGNOSIS — Z9181 History of falling: Secondary | ICD-10-CM | POA: Diagnosis not present

## 2016-09-27 DIAGNOSIS — S7001XD Contusion of right hip, subsequent encounter: Secondary | ICD-10-CM | POA: Diagnosis not present

## 2016-09-27 DIAGNOSIS — W19XXXD Unspecified fall, subsequent encounter: Secondary | ICD-10-CM | POA: Diagnosis not present

## 2016-09-27 DIAGNOSIS — I10 Essential (primary) hypertension: Secondary | ICD-10-CM | POA: Diagnosis not present

## 2016-09-27 DIAGNOSIS — I480 Paroxysmal atrial fibrillation: Secondary | ICD-10-CM | POA: Diagnosis not present

## 2016-09-27 NOTE — Progress Notes (Signed)
ED Antimicrobial Stewardship Positive Culture Follow Up   Deborah Ball is an 83 y.o. female who presented to [facility] on 09/26/2016 with a chief complaint of fall on Xarelto.  Chief Complaint  Patient presents with  . Fall    Taking Xarelto  . Right Hip Pain/Bruise    Recent Results (from the past 720 hour(s))  Urine culture     Status: Abnormal   Collection Time: 09/23/16  2:47 PM  Result Value Ref Range Status   Specimen Description URINE, RANDOM  Final   Special Requests NONE  Final   Culture >=100,000 COLONIES/mL ESCHERICHIA COLI (A)  Final   Report Status 09/26/2016 FINAL  Final   Organism ID, Bacteria ESCHERICHIA COLI (A)  Final      Susceptibility   Escherichia coli - MIC*    AMPICILLIN >=32 RESISTANT Resistant     CEFAZOLIN <=4 SENSITIVE Sensitive     CEFTRIAXONE <=1 SENSITIVE Sensitive     CIPROFLOXACIN <=0.25 SENSITIVE Sensitive     GENTAMICIN <=1 SENSITIVE Sensitive     IMIPENEM <=0.25 SENSITIVE Sensitive     NITROFURANTOIN <=16 SENSITIVE Sensitive     TRIMETH/SULFA <=20 SENSITIVE Sensitive     AMPICILLIN/SULBACTAM 16 INTERMEDIATE Intermediate     PIP/TAZO <=4 SENSITIVE Sensitive     Extended ESBL NEGATIVE Sensitive     * >=100,000 COLONIES/mL ESCHERICHIA COLI   Patient with > 100 K E. Coli in UCx. Discussed with APP. Likely asymptomatic bacteriuria. No treatment warranted.   New antibiotic prescription: None   ED Provider: Mia McDonald    Albertina Parr, PharmD., BCPS Clinical Pharmacist Pager [phone]

## 2016-09-27 NOTE — Telephone Encounter (Signed)
No treatment needed for UC from ED 09/23/16 per Mia McDonald PA-C

## 2016-09-28 DIAGNOSIS — I480 Paroxysmal atrial fibrillation: Secondary | ICD-10-CM | POA: Diagnosis not present

## 2016-09-28 DIAGNOSIS — I10 Essential (primary) hypertension: Secondary | ICD-10-CM | POA: Diagnosis not present

## 2016-09-28 DIAGNOSIS — S7001XD Contusion of right hip, subsequent encounter: Secondary | ICD-10-CM | POA: Diagnosis not present

## 2016-09-28 DIAGNOSIS — S32591D Other specified fracture of right pubis, subsequent encounter for fracture with routine healing: Secondary | ICD-10-CM | POA: Diagnosis not present

## 2016-09-28 DIAGNOSIS — Z9181 History of falling: Secondary | ICD-10-CM | POA: Diagnosis not present

## 2016-09-28 DIAGNOSIS — W19XXXD Unspecified fall, subsequent encounter: Secondary | ICD-10-CM | POA: Diagnosis not present

## 2016-09-29 ENCOUNTER — Ambulatory Visit (HOSPITAL_COMMUNITY)
Admission: RE | Admit: 2016-09-29 | Discharge: 2016-09-29 | Disposition: A | Discharge status: Home / Self Care | Payer: Medicare Other | Source: Ambulatory Visit | Attending: Sports Medicine | Admitting: Sports Medicine

## 2016-09-29 ENCOUNTER — Encounter (HOSPITAL_COMMUNITY): Payer: Self-pay

## 2016-09-29 DIAGNOSIS — Z7901 Long term (current) use of anticoagulants: Secondary | ICD-10-CM | POA: Diagnosis not present

## 2016-09-29 DIAGNOSIS — S7011XA Contusion of right thigh, initial encounter: Secondary | ICD-10-CM | POA: Diagnosis not present

## 2016-09-29 DIAGNOSIS — Z90710 Acquired absence of both cervix and uterus: Secondary | ICD-10-CM | POA: Diagnosis not present

## 2016-09-29 DIAGNOSIS — I119 Hypertensive heart disease without heart failure: Secondary | ICD-10-CM | POA: Diagnosis not present

## 2016-09-29 DIAGNOSIS — Z882 Allergy status to sulfonamides status: Secondary | ICD-10-CM | POA: Diagnosis not present

## 2016-09-29 DIAGNOSIS — Z9889 Other specified postprocedural states: Secondary | ICD-10-CM | POA: Insufficient documentation

## 2016-09-29 DIAGNOSIS — I4891 Unspecified atrial fibrillation: Secondary | ICD-10-CM | POA: Diagnosis not present

## 2016-09-29 DIAGNOSIS — T148XXA Other injury of unspecified body region, initial encounter: Secondary | ICD-10-CM

## 2016-09-29 DIAGNOSIS — Z85840 Personal history of malignant neoplasm of eye: Secondary | ICD-10-CM | POA: Diagnosis not present

## 2016-09-29 DIAGNOSIS — Z79899 Other long term (current) drug therapy: Secondary | ICD-10-CM | POA: Diagnosis not present

## 2016-09-29 DIAGNOSIS — Z9049 Acquired absence of other specified parts of digestive tract: Secondary | ICD-10-CM | POA: Diagnosis not present

## 2016-09-29 DIAGNOSIS — Z880 Allergy status to penicillin: Secondary | ICD-10-CM | POA: Insufficient documentation

## 2016-09-29 DIAGNOSIS — E039 Hypothyroidism, unspecified: Secondary | ICD-10-CM | POA: Diagnosis not present

## 2016-09-29 DIAGNOSIS — Z853 Personal history of malignant neoplasm of breast: Secondary | ICD-10-CM | POA: Diagnosis not present

## 2016-09-29 LAB — CBC WITH DIFFERENTIAL/PLATELET
BASOS PCT: 1 %
Basophils Absolute: 0 10*3/uL (ref 0.0–0.1)
Eosinophils Absolute: 0.2 10*3/uL (ref 0.0–0.7)
Eosinophils Relative: 3 %
HEMATOCRIT: 35.2 % — AB (ref 36.0–46.0)
HEMOGLOBIN: 11.6 g/dL — AB (ref 12.0–15.0)
LYMPHS ABS: 1.7 10*3/uL (ref 0.7–4.0)
Lymphocytes Relative: 24 %
MCH: 33.1 pg (ref 26.0–34.0)
MCHC: 33 g/dL (ref 30.0–36.0)
MCV: 100.6 fL — ABNORMAL HIGH (ref 78.0–100.0)
MONOS PCT: 11 %
Monocytes Absolute: 0.8 10*3/uL (ref 0.1–1.0)
NEUTROS ABS: 4.6 10*3/uL (ref 1.7–7.7)
NEUTROS PCT: 63 %
PLATELETS: 406 10*3/uL — AB (ref 150–400)
RBC: 3.5 MIL/uL — AB (ref 3.87–5.11)
RDW: 16 % — AB (ref 11.5–15.5)
WBC: 7.3 10*3/uL (ref 4.0–10.5)

## 2016-09-29 LAB — PROTIME-INR
INR: 1.14
PROTHROMBIN TIME: 14.5 s (ref 11.4–15.2)

## 2016-09-29 MED ORDER — MIDAZOLAM HCL 2 MG/2ML IJ SOLN
INTRAMUSCULAR | Status: AC
  Filled 2016-09-29: qty 2

## 2016-09-29 MED ORDER — SODIUM CHLORIDE 0.9 % IV SOLN
INTRAVENOUS | Status: DC
  Administered 2016-09-29: 13:00:00 via INTRAVENOUS

## 2016-09-29 MED ORDER — LIDOCAINE-EPINEPHRINE (PF) 2 %-1:200000 IJ SOLN
INTRAMUSCULAR | Status: AC
  Filled 2016-09-29: qty 20

## 2016-09-29 MED ORDER — FENTANYL CITRATE (PF) 100 MCG/2ML IJ SOLN
INTRAMUSCULAR | Status: AC
  Filled 2016-09-29: qty 2

## 2016-09-29 NOTE — Discharge Instructions (Signed)
Needle Biopsy of the Tissue, Care After Refer to this sheet in the next few weeks. These instructions provide you with information about caring for yourself after your procedure. Your health care provider may also give you more specific instructions. Your treatment has been planned according to current medical practices, but problems sometimes occur. Call your health care provider if you have any problems or questions after your procedure. What can I expect after the procedure? After your procedure, it is common to have soreness or tenderness at the puncture site. Follow these instructions at home:  Take over-the-counter and prescription medicines only as told by your health care provider.  Bathe and shower as told by your health care provider.  Follow instructions from your health care provider about: ? How to take care of your puncture site. ? When and how you should change your bandage (dressing). ? When you should remove your dressing.  Check your puncture site every day for signs of infection. Watch for: ? Redness, swelling, or worsening pain. ? Fluid, blood, or pus.  Return to your normal activities as told by your health care provider.  Keep all follow-up visits as told by your health care provider. This is important. Contact a health care provider if:  You have redness, swelling, or worsening pain at the site of your puncture.  You have fluid, blood, or pus coming from your puncture site.  You have a fever.  You have persistent nausea or vomiting. Get help right away if:  You develop a rash.  You have difficulty breathing. This information is not intended to replace advice given to you by your health care provider. Make sure you discuss any questions you have with your health care provider. Document Released: 08/01/2004 Document Revised: 06/20/2015 Document Reviewed: 02/19/2014 Elsevier Interactive Patient Education  [employer].

## 2016-09-29 NOTE — Procedures (Signed)
Pre Procedure Dx: Right lateral thigh hematoma Post Procedural Dx: Same  Attempted though un-successful right lateral thigh SQ hematoma aspiration secondary to the organized nature of the collection.  EBL: None  No immediate complications.   Ronny Bacon, MD Pager #: [phone]

## 2016-09-29 NOTE — Consult Note (Signed)
Chief Complaint: Patient was seen in consultation today for  ultrasound-guided aspiration of right thigh hematoma  Referring Physician(s): Bassett,Rebecca S  Supervising Physician: Sandi Mariscal  Patient Status: [facility] - Out-pt  History of Present Illness: Deborah Ball is a 83 y.o. female with history of atrial fibrillation on xarelto, right breast and right eye cancer, prior ORIF of right femur, hypertension and hypothyroidism who fell at home on 8/21with subsequent right lower extremity pain and swelling. Imaging revealed comminuted fracture the right symphysis pubis as well as 8.8 x 3.9 x 5.6 cm diameter heterogeneous isoechoic to hypoechoic collection at the site of clinical concern at the right hip region most consistent with a subcutaneous hematoma. She presents today for ultrasound-guided aspiration of the right thigh hematoma.  Past Medical History:  Diagnosis Date  . A-fib ([place])   . Cancer ([place])    hx R breast cancer, R eye cancer-squamous cell 2018  . Hypertension   . Thyroid disease     Past Surgical History:  Procedure Laterality Date  . ABDOMINAL HYSTERECTOMY    . ABDOMINAL SURGERY    . APPENDECTOMY    . BREAST SURGERY    . CARDIOVERSION     x2 2017/2018  . CHOLECYSTECTOMY      Allergies: Aspirin; Ciprofloxacin; Food; Iodinated diagnostic agents; Penicillins; Phenobarbital; Statins; and Sulfa antibiotics  Medications: Prior to Admission medications   Medication Sig Start Date End Date Taking? Authorizing Provider  acetaminophen (TYLENOL) 500 MG tablet Take 500 mg by mouth at bedtime as needed.   Yes [provider]  acetaminophen (TYLENOL) 650 MG CR tablet Take 650 mg by mouth every 8 (eight) hours as needed for pain.   Yes [provider]  amiodarone (PACERONE) 200 MG tablet Take 200 mg by mouth daily.   Yes [provider]  B Complex-C (B-COMPLEX WITH VITAMIN C) tablet Take 1 tablet by mouth daily. *cuts tablet in half to take the  whole tablet total   Yes [provider]  diltiazem (CARTIA XT) 240 MG 24 hr capsule Take 240 mg by mouth daily.  06/25/15  Yes [provider]  HYDROcodone-acetaminophen (NORCO/VICODIN) 5-325 MG tablet Take 0.5-1 tablets by mouth at bedtime as needed for moderate pain.   Yes [provider]  levothyroxine (SYNTHROID, LEVOTHROID) 50 MCG tablet Take 50 mcg by mouth daily before breakfast.  03/24/10  Yes [provider]  mupirocin ointment (BACTROBAN) 2 % Place 1 application into the nose daily as needed (for skin wounds).   Yes [provider]  Propylene Glycol (SYSTANE BALANCE) 0.6 % SOLN Place 1 drop into both eyes daily as needed (for dry eyes).   Yes [provider]  vitamin B-12 (CYANOCOBALAMIN) 1000 MCG tablet Take 1,000 mcg by mouth daily. *Cuts tablet in half to take the whole tablet total   Yes [provider]  Vitamin D, Ergocalciferol, (DRISDOL) 50000 units CAPS capsule Take 50,000 Units by mouth every 7 (seven) days. friday   Yes [provider]  amiodarone (PACERONE) 400 MG tablet Take 400 mg by mouth. 04/20/16 05/20/16  [provider]  rivaroxaban (XARELTO) 20 MG TABS tablet Take 20 mg by mouth daily with supper.  06/01/14   [provider]     History reviewed. No pertinent family history.  Social History   Social History  . Marital status: Widowed    Spouse name: N/A  . Number of children: N/A  . Years of education: N/A   Social History Main  Topics  . Smoking status: Never Smoker  . Smokeless tobacco: Never Used  . Alcohol use No  . Drug use: Unknown  . Sexual activity: Not Asked   Other Topics Concern  . None   Social History Narrative  . None      Review of Systems currently denies fever, headache, chest pain, dyspnea, cough,back pain, nausea, vomiting, hemoptysis or blood in stool. She does have right lower extremity and right lower abdominal discomfort, some lower extremity  neuropathy and easy bruising.  Vital Signs: Pulse 79   Temp 98 F (36.7 C) (Oral)   Resp 16   SpO2 93%   Physical Exam awake, alert. Chest clear to auscultation bilaterally. Heart with nl rate, occ ectopy noted;  Abdomen soft, positive bowel sounds, some mild pelvic tenderness to palpation; right lower extremity with ecchymosis from hip to lower leg. Palpable hematoma lateral aspect of right thigh, tender to palpation; trace edema both lower extremities. Feet warm with good range of motion.  Mallampati Score:     Imaging: Dg Chest 2 View  Result Date: 09/23/2016 CLINICAL DATA:  Arrived via EMS from patient's home. Patient fell one week ago inside her daughter's home while walking with her walker and had right hip pain with ecchymosis. Pt was at the home today and patient having increased right hip pain. EXAM: CHEST  2 VIEW COMPARISON:  None. FINDINGS: Heart is mildly enlarged. The aorta is partially calcified and tortuous. There are no focal consolidations or pleural effusions. No pulmonary edema. No evidence for pneumothorax or acute displaced fractures. Chronic changes are seen in both shoulders. Degenerative changes are seen in thoracic spine. IMPRESSION: 1. Mild cardiomegaly. 2.  No evidence for acute pulmonary abnormality. 3.  Aortic atherosclerosis.  (ICD10-I70.0) tortuous aorta. Electronically Signed   By: Nolon Nations M.D.   On: 09/23/2016 14:20   [place] Rt Lower Extrem Ltd Soft Tissue Non Vascular  Result Date: 09/22/2016 CLINICAL DATA:  Hematoma RIGHT lower extremity for 1 week, pelvic fracture a 1 week ago EXAM: ULTRASOUND RIGHT LOWER EXTREMITY SOFT TISSUE LIMITED TECHNIQUE: Ultrasound examination of the lower extremity soft tissues was performed in the area of clinical concern at the RIGHT hip. COMPARISON:  None FINDINGS: Sonography was performed at the site of clinical concern in the RIGHT lower extremity at the RIGHT hip region. At this site, a complex heterogeneous isoechoic to  hypoechoic subcutaneous subcutaneous mass lesion is identified measuring 8.8 x 3.9 x 5.6 cm. No internal blood flow on color Doppler imaging. Finding is most consistent with a subcutaneous hematoma. IMPRESSION: 8.8 x 3.9 x 5.6 cm diameter heterogeneous isoechoic to hypoechoic collection at the site of clinical concern at the RIGHT hip region most consistent with a subcutaneous hematoma. Clinical management/followup until resolution recommended to exclude underlying abnormalities. Electronically Signed   By: Lavonia Dana M.D.   On: 09/22/2016 17:33   Dg Hip Unilat W Or Wo Pelvis 2-3 Views Right  Result Date: 09/23/2016 CLINICAL DATA:  Arrived via EMS from patient's home. Patient fell one week ago inside her daughter's home while walking with her walker and had right hip pain with ecchymosis. Pt was at the home today and patient having increased right hip pain.*comment was truncated* EXAM: DG HIP (WITH OR WITHOUT PELVIS) 2-3V RIGHT COMPARISON:  10/31/2015 FINDINGS: Patient has had ORIF of the right femur. There is heterotopic bone surrounding the ORIF. Visualized portion of the right femur appears intact. There are degenerative changes in both hips and lumbar spine. There  is a comminuted fracture of the right symphysis pubis. Portions of the pelvis are obscured by overlying bowel gas. IMPRESSION: Comminuted fracture of the right symphysis pubis. Remote ORIF of the right femur. Degenerative changes. Electronically Signed   By: Nolon Nations M.D.   On: 09/23/2016 14:25    Labs:  CBC:  Recent Labs  10/31/15 1315 09/23/16 1245 09/26/16 2032  WBC 9.3 6.4 7.6  HGB 13.2 11.6* 11.5*  HCT 40.8 36.0 36.2  PLT 297 385 435*    COAGS:  Recent Labs  09/26/16 2032  INR 1.40    BMP:  Recent Labs  10/31/15 1315 09/23/16 1245 09/26/16 2032  NA 138 140 142  K 3.7 4.1 4.1  CL 106 108 108  CO2 26 25 26   GLUCOSE 104* 104* 126*  BUN 8 14 15   CALCIUM 8.7* 8.5* 8.6*  CREATININE 0.71 0.78 0.90    GFRNONAA >60 >60 54*  GFRAA >60 >60 >60    LIVER FUNCTION TESTS:  Recent Labs  10/31/15 1315 09/23/16 1336 09/26/16 2032  BILITOT 0.8 1.2 0.7  AST 16 33 34  ALT 14 27 29   ALKPHOS 69 77 103  PROT 6.4* 6.3* 6.5  ALBUMIN 3.4* 3.0* 3.1*    TUMOR MARKERS: No results for input(s): AFPTM, CEA, CA199, CHROMGRNA in the last 8760 hours.  Assessment and Plan:  83 y.o. female with history of atrial fibrillation on xarelto, right breast and right eye cancer, prior ORIF of right femur, hypertension and hypothyroidism who fell at home on 8/21with subsequent right lower extremity pain and swelling. Imaging revealed comminuted fracture the right symphysis pubis as well as 8.8 x 3.9 x 5.6 cm diameter heterogeneous isoechoic to hypoechoic collection at the site of clinical concern at the right hip region most consistent with a subcutaneous hematoma. She presents today for ultrasound-guided aspiration of the right thigh hematoma.Details/risks of procedure, including but not limited to, internal bleeding, infection, injury to adjacent structures, discussed with patient and family with their understanding and consent.m Labs pending.   Thank you for this interesting consult.  I greatly enjoyed meeting Deborah Ball and look forward to participating in their care.  A copy of this report was sent to the requesting provider on this date.  Electronically Signed: D. Rowe Robert, PA-C 09/29/2016, 12:17 PM   I spent a total of  25 minutes   in face to face in clinical consultation, greater than 50% of which was counseling/coordinating care for ultrasound-guided aspiration of right thigh hematoma

## 2016-09-30 DIAGNOSIS — W19XXXD Unspecified fall, subsequent encounter: Secondary | ICD-10-CM | POA: Diagnosis not present

## 2016-09-30 DIAGNOSIS — I480 Paroxysmal atrial fibrillation: Secondary | ICD-10-CM | POA: Diagnosis not present

## 2016-09-30 DIAGNOSIS — Z9181 History of falling: Secondary | ICD-10-CM | POA: Diagnosis not present

## 2016-09-30 DIAGNOSIS — S7001XD Contusion of right hip, subsequent encounter: Secondary | ICD-10-CM | POA: Diagnosis not present

## 2016-09-30 DIAGNOSIS — S32591D Other specified fracture of right pubis, subsequent encounter for fracture with routine healing: Secondary | ICD-10-CM | POA: Diagnosis not present

## 2016-09-30 DIAGNOSIS — I10 Essential (primary) hypertension: Secondary | ICD-10-CM | POA: Diagnosis not present

## 2016-10-01 DIAGNOSIS — S32591A Other specified fracture of right pubis, initial encounter for closed fracture: Secondary | ICD-10-CM | POA: Diagnosis not present

## 2016-10-02 DIAGNOSIS — S32591D Other specified fracture of right pubis, subsequent encounter for fracture with routine healing: Secondary | ICD-10-CM | POA: Diagnosis not present

## 2016-10-02 DIAGNOSIS — S7001XD Contusion of right hip, subsequent encounter: Secondary | ICD-10-CM | POA: Diagnosis not present

## 2016-10-02 DIAGNOSIS — W19XXXD Unspecified fall, subsequent encounter: Secondary | ICD-10-CM | POA: Diagnosis not present

## 2016-10-02 DIAGNOSIS — I10 Essential (primary) hypertension: Secondary | ICD-10-CM | POA: Diagnosis not present

## 2016-10-02 DIAGNOSIS — Z9181 History of falling: Secondary | ICD-10-CM | POA: Diagnosis not present

## 2016-10-02 DIAGNOSIS — I480 Paroxysmal atrial fibrillation: Secondary | ICD-10-CM | POA: Diagnosis not present

## 2016-10-05 DIAGNOSIS — Z9181 History of falling: Secondary | ICD-10-CM | POA: Diagnosis not present

## 2016-10-05 DIAGNOSIS — I480 Paroxysmal atrial fibrillation: Secondary | ICD-10-CM | POA: Diagnosis not present

## 2016-10-05 DIAGNOSIS — S7001XD Contusion of right hip, subsequent encounter: Secondary | ICD-10-CM | POA: Diagnosis not present

## 2016-10-05 DIAGNOSIS — W19XXXD Unspecified fall, subsequent encounter: Secondary | ICD-10-CM | POA: Diagnosis not present

## 2016-10-05 DIAGNOSIS — S32591D Other specified fracture of right pubis, subsequent encounter for fracture with routine healing: Secondary | ICD-10-CM | POA: Diagnosis not present

## 2016-10-05 DIAGNOSIS — I10 Essential (primary) hypertension: Secondary | ICD-10-CM | POA: Diagnosis not present

## 2016-10-06 DIAGNOSIS — W19XXXD Unspecified fall, subsequent encounter: Secondary | ICD-10-CM | POA: Diagnosis not present

## 2016-10-06 DIAGNOSIS — I10 Essential (primary) hypertension: Secondary | ICD-10-CM | POA: Diagnosis not present

## 2016-10-06 DIAGNOSIS — I480 Paroxysmal atrial fibrillation: Secondary | ICD-10-CM | POA: Diagnosis not present

## 2016-10-06 DIAGNOSIS — Z9181 History of falling: Secondary | ICD-10-CM | POA: Diagnosis not present

## 2016-10-06 DIAGNOSIS — S32591D Other specified fracture of right pubis, subsequent encounter for fracture with routine healing: Secondary | ICD-10-CM | POA: Diagnosis not present

## 2016-10-06 DIAGNOSIS — S7001XD Contusion of right hip, subsequent encounter: Secondary | ICD-10-CM | POA: Diagnosis not present

## 2016-10-07 DIAGNOSIS — W19XXXD Unspecified fall, subsequent encounter: Secondary | ICD-10-CM | POA: Diagnosis not present

## 2016-10-07 DIAGNOSIS — I480 Paroxysmal atrial fibrillation: Secondary | ICD-10-CM | POA: Diagnosis not present

## 2016-10-07 DIAGNOSIS — Z9181 History of falling: Secondary | ICD-10-CM | POA: Diagnosis not present

## 2016-10-07 DIAGNOSIS — I10 Essential (primary) hypertension: Secondary | ICD-10-CM | POA: Diagnosis not present

## 2016-10-07 DIAGNOSIS — S32591D Other specified fracture of right pubis, subsequent encounter for fracture with routine healing: Secondary | ICD-10-CM | POA: Diagnosis not present

## 2016-10-07 DIAGNOSIS — S7001XD Contusion of right hip, subsequent encounter: Secondary | ICD-10-CM | POA: Diagnosis not present

## 2016-10-08 DIAGNOSIS — I480 Paroxysmal atrial fibrillation: Secondary | ICD-10-CM | POA: Diagnosis not present

## 2016-10-08 DIAGNOSIS — S7001XD Contusion of right hip, subsequent encounter: Secondary | ICD-10-CM | POA: Diagnosis not present

## 2016-10-08 DIAGNOSIS — W19XXXD Unspecified fall, subsequent encounter: Secondary | ICD-10-CM | POA: Diagnosis not present

## 2016-10-08 DIAGNOSIS — Z9181 History of falling: Secondary | ICD-10-CM | POA: Diagnosis not present

## 2016-10-08 DIAGNOSIS — S32591D Other specified fracture of right pubis, subsequent encounter for fracture with routine healing: Secondary | ICD-10-CM | POA: Diagnosis not present

## 2016-10-08 DIAGNOSIS — I10 Essential (primary) hypertension: Secondary | ICD-10-CM | POA: Diagnosis not present

## 2016-10-09 DIAGNOSIS — S7001XD Contusion of right hip, subsequent encounter: Secondary | ICD-10-CM | POA: Diagnosis not present

## 2016-10-09 DIAGNOSIS — I480 Paroxysmal atrial fibrillation: Secondary | ICD-10-CM | POA: Diagnosis not present

## 2016-10-09 DIAGNOSIS — W19XXXD Unspecified fall, subsequent encounter: Secondary | ICD-10-CM | POA: Diagnosis not present

## 2016-10-09 DIAGNOSIS — S32591D Other specified fracture of right pubis, subsequent encounter for fracture with routine healing: Secondary | ICD-10-CM | POA: Diagnosis not present

## 2016-10-09 DIAGNOSIS — Z9181 History of falling: Secondary | ICD-10-CM | POA: Diagnosis not present

## 2016-10-09 DIAGNOSIS — I10 Essential (primary) hypertension: Secondary | ICD-10-CM | POA: Diagnosis not present

## 2016-10-12 DIAGNOSIS — Z9181 History of falling: Secondary | ICD-10-CM | POA: Diagnosis not present

## 2016-10-12 DIAGNOSIS — S32591D Other specified fracture of right pubis, subsequent encounter for fracture with routine healing: Secondary | ICD-10-CM | POA: Diagnosis not present

## 2016-10-12 DIAGNOSIS — I480 Paroxysmal atrial fibrillation: Secondary | ICD-10-CM | POA: Diagnosis not present

## 2016-10-12 DIAGNOSIS — I10 Essential (primary) hypertension: Secondary | ICD-10-CM | POA: Diagnosis not present

## 2016-10-12 DIAGNOSIS — W19XXXD Unspecified fall, subsequent encounter: Secondary | ICD-10-CM | POA: Diagnosis not present

## 2016-10-12 DIAGNOSIS — S7001XD Contusion of right hip, subsequent encounter: Secondary | ICD-10-CM | POA: Diagnosis not present

## 2016-10-13 DIAGNOSIS — W19XXXD Unspecified fall, subsequent encounter: Secondary | ICD-10-CM | POA: Diagnosis not present

## 2016-10-13 DIAGNOSIS — S7001XD Contusion of right hip, subsequent encounter: Secondary | ICD-10-CM | POA: Diagnosis not present

## 2016-10-13 DIAGNOSIS — S32591D Other specified fracture of right pubis, subsequent encounter for fracture with routine healing: Secondary | ICD-10-CM | POA: Diagnosis not present

## 2016-10-13 DIAGNOSIS — I10 Essential (primary) hypertension: Secondary | ICD-10-CM | POA: Diagnosis not present

## 2016-10-13 DIAGNOSIS — Z9181 History of falling: Secondary | ICD-10-CM | POA: Diagnosis not present

## 2016-10-13 DIAGNOSIS — I480 Paroxysmal atrial fibrillation: Secondary | ICD-10-CM | POA: Diagnosis not present

## 2016-10-14 DIAGNOSIS — C44629 Squamous cell carcinoma of skin of left upper limb, including shoulder: Secondary | ICD-10-CM | POA: Diagnosis not present

## 2016-10-15 DIAGNOSIS — S32591D Other specified fracture of right pubis, subsequent encounter for fracture with routine healing: Secondary | ICD-10-CM | POA: Diagnosis not present

## 2016-10-15 DIAGNOSIS — Z9181 History of falling: Secondary | ICD-10-CM | POA: Diagnosis not present

## 2016-10-15 DIAGNOSIS — S7001XD Contusion of right hip, subsequent encounter: Secondary | ICD-10-CM | POA: Diagnosis not present

## 2016-10-15 DIAGNOSIS — I10 Essential (primary) hypertension: Secondary | ICD-10-CM | POA: Diagnosis not present

## 2016-10-15 DIAGNOSIS — I480 Paroxysmal atrial fibrillation: Secondary | ICD-10-CM | POA: Diagnosis not present

## 2016-10-15 DIAGNOSIS — W19XXXD Unspecified fall, subsequent encounter: Secondary | ICD-10-CM | POA: Diagnosis not present

## 2016-10-19 DIAGNOSIS — I480 Paroxysmal atrial fibrillation: Secondary | ICD-10-CM | POA: Diagnosis not present

## 2016-10-19 DIAGNOSIS — Z9181 History of falling: Secondary | ICD-10-CM | POA: Diagnosis not present

## 2016-10-19 DIAGNOSIS — S32591D Other specified fracture of right pubis, subsequent encounter for fracture with routine healing: Secondary | ICD-10-CM | POA: Diagnosis not present

## 2016-10-19 DIAGNOSIS — S7001XD Contusion of right hip, subsequent encounter: Secondary | ICD-10-CM | POA: Diagnosis not present

## 2016-10-19 DIAGNOSIS — W19XXXD Unspecified fall, subsequent encounter: Secondary | ICD-10-CM | POA: Diagnosis not present

## 2016-10-19 DIAGNOSIS — I10 Essential (primary) hypertension: Secondary | ICD-10-CM | POA: Diagnosis not present

## 2016-10-20 DIAGNOSIS — S32591D Other specified fracture of right pubis, subsequent encounter for fracture with routine healing: Secondary | ICD-10-CM | POA: Diagnosis not present

## 2016-10-20 DIAGNOSIS — Z9181 History of falling: Secondary | ICD-10-CM | POA: Diagnosis not present

## 2016-10-20 DIAGNOSIS — S7001XD Contusion of right hip, subsequent encounter: Secondary | ICD-10-CM | POA: Diagnosis not present

## 2016-10-20 DIAGNOSIS — W19XXXD Unspecified fall, subsequent encounter: Secondary | ICD-10-CM | POA: Diagnosis not present

## 2016-10-20 DIAGNOSIS — I480 Paroxysmal atrial fibrillation: Secondary | ICD-10-CM | POA: Diagnosis not present

## 2016-10-20 DIAGNOSIS — I10 Essential (primary) hypertension: Secondary | ICD-10-CM | POA: Diagnosis not present

## 2016-10-21 DIAGNOSIS — I10 Essential (primary) hypertension: Secondary | ICD-10-CM | POA: Diagnosis not present

## 2016-10-21 DIAGNOSIS — S32591D Other specified fracture of right pubis, subsequent encounter for fracture with routine healing: Secondary | ICD-10-CM | POA: Diagnosis not present

## 2016-10-21 DIAGNOSIS — S7001XD Contusion of right hip, subsequent encounter: Secondary | ICD-10-CM | POA: Diagnosis not present

## 2016-10-21 DIAGNOSIS — W19XXXD Unspecified fall, subsequent encounter: Secondary | ICD-10-CM | POA: Diagnosis not present

## 2016-10-21 DIAGNOSIS — Z9181 History of falling: Secondary | ICD-10-CM | POA: Diagnosis not present

## 2016-10-21 DIAGNOSIS — C44629 Squamous cell carcinoma of skin of left upper limb, including shoulder: Secondary | ICD-10-CM | POA: Diagnosis not present

## 2016-10-21 DIAGNOSIS — I480 Paroxysmal atrial fibrillation: Secondary | ICD-10-CM | POA: Diagnosis not present

## 2016-10-22 DIAGNOSIS — I480 Paroxysmal atrial fibrillation: Secondary | ICD-10-CM | POA: Diagnosis not present

## 2016-10-22 DIAGNOSIS — W19XXXD Unspecified fall, subsequent encounter: Secondary | ICD-10-CM | POA: Diagnosis not present

## 2016-10-22 DIAGNOSIS — S7001XD Contusion of right hip, subsequent encounter: Secondary | ICD-10-CM | POA: Diagnosis not present

## 2016-10-22 DIAGNOSIS — S32591D Other specified fracture of right pubis, subsequent encounter for fracture with routine healing: Secondary | ICD-10-CM | POA: Diagnosis not present

## 2016-10-22 DIAGNOSIS — I10 Essential (primary) hypertension: Secondary | ICD-10-CM | POA: Diagnosis not present

## 2016-10-22 DIAGNOSIS — Z9181 History of falling: Secondary | ICD-10-CM | POA: Diagnosis not present

## 2016-10-23 DIAGNOSIS — I480 Paroxysmal atrial fibrillation: Secondary | ICD-10-CM | POA: Diagnosis not present

## 2016-10-23 DIAGNOSIS — W19XXXD Unspecified fall, subsequent encounter: Secondary | ICD-10-CM | POA: Diagnosis not present

## 2016-10-23 DIAGNOSIS — S32591D Other specified fracture of right pubis, subsequent encounter for fracture with routine healing: Secondary | ICD-10-CM | POA: Diagnosis not present

## 2016-10-23 DIAGNOSIS — I10 Essential (primary) hypertension: Secondary | ICD-10-CM | POA: Diagnosis not present

## 2016-10-23 DIAGNOSIS — Z9181 History of falling: Secondary | ICD-10-CM | POA: Diagnosis not present

## 2016-10-23 DIAGNOSIS — S7001XD Contusion of right hip, subsequent encounter: Secondary | ICD-10-CM | POA: Diagnosis not present

## 2016-10-26 DIAGNOSIS — I10 Essential (primary) hypertension: Secondary | ICD-10-CM | POA: Diagnosis not present

## 2016-10-26 DIAGNOSIS — S32591D Other specified fracture of right pubis, subsequent encounter for fracture with routine healing: Secondary | ICD-10-CM | POA: Diagnosis not present

## 2016-10-26 DIAGNOSIS — Z9181 History of falling: Secondary | ICD-10-CM | POA: Diagnosis not present

## 2016-10-26 DIAGNOSIS — I480 Paroxysmal atrial fibrillation: Secondary | ICD-10-CM | POA: Diagnosis not present

## 2016-10-26 DIAGNOSIS — S7001XD Contusion of right hip, subsequent encounter: Secondary | ICD-10-CM | POA: Diagnosis not present

## 2016-10-26 DIAGNOSIS — W19XXXD Unspecified fall, subsequent encounter: Secondary | ICD-10-CM | POA: Diagnosis not present

## 2016-10-28 DIAGNOSIS — S32591D Other specified fracture of right pubis, subsequent encounter for fracture with routine healing: Secondary | ICD-10-CM | POA: Diagnosis not present

## 2016-10-28 DIAGNOSIS — M810 Age-related osteoporosis without current pathological fracture: Secondary | ICD-10-CM | POA: Diagnosis not present

## 2016-10-29 DIAGNOSIS — S7001XD Contusion of right hip, subsequent encounter: Secondary | ICD-10-CM | POA: Diagnosis not present

## 2016-10-29 DIAGNOSIS — I480 Paroxysmal atrial fibrillation: Secondary | ICD-10-CM | POA: Diagnosis not present

## 2016-10-29 DIAGNOSIS — Z9181 History of falling: Secondary | ICD-10-CM | POA: Diagnosis not present

## 2016-10-29 DIAGNOSIS — I10 Essential (primary) hypertension: Secondary | ICD-10-CM | POA: Diagnosis not present

## 2016-10-29 DIAGNOSIS — W19XXXD Unspecified fall, subsequent encounter: Secondary | ICD-10-CM | POA: Diagnosis not present

## 2016-10-29 DIAGNOSIS — S32591D Other specified fracture of right pubis, subsequent encounter for fracture with routine healing: Secondary | ICD-10-CM | POA: Diagnosis not present

## 2016-11-02 DIAGNOSIS — W19XXXD Unspecified fall, subsequent encounter: Secondary | ICD-10-CM | POA: Diagnosis not present

## 2016-11-02 DIAGNOSIS — I10 Essential (primary) hypertension: Secondary | ICD-10-CM | POA: Diagnosis not present

## 2016-11-02 DIAGNOSIS — Z9181 History of falling: Secondary | ICD-10-CM | POA: Diagnosis not present

## 2016-11-02 DIAGNOSIS — S7001XD Contusion of right hip, subsequent encounter: Secondary | ICD-10-CM | POA: Diagnosis not present

## 2016-11-02 DIAGNOSIS — S32591D Other specified fracture of right pubis, subsequent encounter for fracture with routine healing: Secondary | ICD-10-CM | POA: Diagnosis not present

## 2016-11-02 DIAGNOSIS — I480 Paroxysmal atrial fibrillation: Secondary | ICD-10-CM | POA: Diagnosis not present

## 2016-11-05 DIAGNOSIS — I480 Paroxysmal atrial fibrillation: Secondary | ICD-10-CM | POA: Diagnosis not present

## 2016-11-05 DIAGNOSIS — I10 Essential (primary) hypertension: Secondary | ICD-10-CM | POA: Diagnosis not present

## 2016-11-05 DIAGNOSIS — S7001XD Contusion of right hip, subsequent encounter: Secondary | ICD-10-CM | POA: Diagnosis not present

## 2016-11-05 DIAGNOSIS — W19XXXD Unspecified fall, subsequent encounter: Secondary | ICD-10-CM | POA: Diagnosis not present

## 2016-11-05 DIAGNOSIS — S32591D Other specified fracture of right pubis, subsequent encounter for fracture with routine healing: Secondary | ICD-10-CM | POA: Diagnosis not present

## 2016-11-05 DIAGNOSIS — Z9181 History of falling: Secondary | ICD-10-CM | POA: Diagnosis not present

## 2016-11-09 DIAGNOSIS — I480 Paroxysmal atrial fibrillation: Secondary | ICD-10-CM | POA: Diagnosis not present

## 2016-11-09 DIAGNOSIS — Z9181 History of falling: Secondary | ICD-10-CM | POA: Diagnosis not present

## 2016-11-09 DIAGNOSIS — S7001XD Contusion of right hip, subsequent encounter: Secondary | ICD-10-CM | POA: Diagnosis not present

## 2016-11-09 DIAGNOSIS — I10 Essential (primary) hypertension: Secondary | ICD-10-CM | POA: Diagnosis not present

## 2016-11-09 DIAGNOSIS — S32591D Other specified fracture of right pubis, subsequent encounter for fracture with routine healing: Secondary | ICD-10-CM | POA: Diagnosis not present

## 2016-11-09 DIAGNOSIS — W19XXXD Unspecified fall, subsequent encounter: Secondary | ICD-10-CM | POA: Diagnosis not present

## 2016-11-11 DIAGNOSIS — S32591D Other specified fracture of right pubis, subsequent encounter for fracture with routine healing: Secondary | ICD-10-CM | POA: Diagnosis not present

## 2016-11-11 DIAGNOSIS — S7001XD Contusion of right hip, subsequent encounter: Secondary | ICD-10-CM | POA: Diagnosis not present

## 2016-11-11 DIAGNOSIS — I480 Paroxysmal atrial fibrillation: Secondary | ICD-10-CM | POA: Diagnosis not present

## 2016-11-11 DIAGNOSIS — Z9181 History of falling: Secondary | ICD-10-CM | POA: Diagnosis not present

## 2016-11-11 DIAGNOSIS — W19XXXD Unspecified fall, subsequent encounter: Secondary | ICD-10-CM | POA: Diagnosis not present

## 2016-11-11 DIAGNOSIS — I10 Essential (primary) hypertension: Secondary | ICD-10-CM | POA: Diagnosis not present

## 2016-11-13 DIAGNOSIS — I10 Essential (primary) hypertension: Secondary | ICD-10-CM | POA: Diagnosis not present

## 2016-11-13 DIAGNOSIS — I480 Paroxysmal atrial fibrillation: Secondary | ICD-10-CM | POA: Diagnosis not present

## 2016-11-13 DIAGNOSIS — S7001XD Contusion of right hip, subsequent encounter: Secondary | ICD-10-CM | POA: Diagnosis not present

## 2016-11-13 DIAGNOSIS — Z9181 History of falling: Secondary | ICD-10-CM | POA: Diagnosis not present

## 2016-11-13 DIAGNOSIS — S32591D Other specified fracture of right pubis, subsequent encounter for fracture with routine healing: Secondary | ICD-10-CM | POA: Diagnosis not present

## 2016-11-13 DIAGNOSIS — W19XXXD Unspecified fall, subsequent encounter: Secondary | ICD-10-CM | POA: Diagnosis not present

## 2016-11-19 DIAGNOSIS — I10 Essential (primary) hypertension: Secondary | ICD-10-CM | POA: Diagnosis not present

## 2016-11-19 DIAGNOSIS — S7001XD Contusion of right hip, subsequent encounter: Secondary | ICD-10-CM | POA: Diagnosis not present

## 2016-11-19 DIAGNOSIS — Z9181 History of falling: Secondary | ICD-10-CM | POA: Diagnosis not present

## 2016-11-19 DIAGNOSIS — S32591D Other specified fracture of right pubis, subsequent encounter for fracture with routine healing: Secondary | ICD-10-CM | POA: Diagnosis not present

## 2016-11-19 DIAGNOSIS — W19XXXD Unspecified fall, subsequent encounter: Secondary | ICD-10-CM | POA: Diagnosis not present

## 2016-11-19 DIAGNOSIS — I480 Paroxysmal atrial fibrillation: Secondary | ICD-10-CM | POA: Diagnosis not present

## 2016-11-20 DIAGNOSIS — W19XXXD Unspecified fall, subsequent encounter: Secondary | ICD-10-CM | POA: Diagnosis not present

## 2016-11-20 DIAGNOSIS — Z9181 History of falling: Secondary | ICD-10-CM | POA: Diagnosis not present

## 2016-11-20 DIAGNOSIS — S32591D Other specified fracture of right pubis, subsequent encounter for fracture with routine healing: Secondary | ICD-10-CM | POA: Diagnosis not present

## 2016-11-20 DIAGNOSIS — S7001XD Contusion of right hip, subsequent encounter: Secondary | ICD-10-CM | POA: Diagnosis not present

## 2016-11-20 DIAGNOSIS — I480 Paroxysmal atrial fibrillation: Secondary | ICD-10-CM | POA: Diagnosis not present

## 2016-11-20 DIAGNOSIS — I10 Essential (primary) hypertension: Secondary | ICD-10-CM | POA: Diagnosis not present

## 2016-11-23 DIAGNOSIS — Z9181 History of falling: Secondary | ICD-10-CM | POA: Diagnosis not present

## 2016-11-23 DIAGNOSIS — W19XXXD Unspecified fall, subsequent encounter: Secondary | ICD-10-CM | POA: Diagnosis not present

## 2016-11-23 DIAGNOSIS — I10 Essential (primary) hypertension: Secondary | ICD-10-CM | POA: Diagnosis not present

## 2016-11-23 DIAGNOSIS — S7001XD Contusion of right hip, subsequent encounter: Secondary | ICD-10-CM | POA: Diagnosis not present

## 2016-11-23 DIAGNOSIS — I480 Paroxysmal atrial fibrillation: Secondary | ICD-10-CM | POA: Diagnosis not present

## 2016-11-23 DIAGNOSIS — S32591D Other specified fracture of right pubis, subsequent encounter for fracture with routine healing: Secondary | ICD-10-CM | POA: Diagnosis not present

## 2016-11-25 ENCOUNTER — Ambulatory Visit: Payer: Medicare Other | Admitting: Sports Medicine

## 2016-11-26 ENCOUNTER — Ambulatory Visit (INDEPENDENT_AMBULATORY_CARE_PROVIDER_SITE_OTHER): Payer: Medicare Other | Admitting: Sports Medicine

## 2016-11-26 DIAGNOSIS — I10 Essential (primary) hypertension: Secondary | ICD-10-CM | POA: Diagnosis not present

## 2016-11-26 DIAGNOSIS — I739 Peripheral vascular disease, unspecified: Secondary | ICD-10-CM | POA: Diagnosis not present

## 2016-11-26 DIAGNOSIS — L84 Corns and callosities: Secondary | ICD-10-CM

## 2016-11-26 DIAGNOSIS — M79672 Pain in left foot: Secondary | ICD-10-CM

## 2016-11-26 DIAGNOSIS — W19XXXD Unspecified fall, subsequent encounter: Secondary | ICD-10-CM | POA: Diagnosis not present

## 2016-11-26 DIAGNOSIS — I480 Paroxysmal atrial fibrillation: Secondary | ICD-10-CM | POA: Diagnosis not present

## 2016-11-26 DIAGNOSIS — Z7901 Long term (current) use of anticoagulants: Secondary | ICD-10-CM | POA: Diagnosis not present

## 2016-11-26 DIAGNOSIS — M79671 Pain in right foot: Secondary | ICD-10-CM | POA: Diagnosis not present

## 2016-11-26 DIAGNOSIS — B351 Tinea unguium: Secondary | ICD-10-CM | POA: Diagnosis not present

## 2016-11-26 DIAGNOSIS — S7001XD Contusion of right hip, subsequent encounter: Secondary | ICD-10-CM | POA: Diagnosis not present

## 2016-11-26 DIAGNOSIS — Z9181 History of falling: Secondary | ICD-10-CM | POA: Diagnosis not present

## 2016-11-26 DIAGNOSIS — S32591D Other specified fracture of right pubis, subsequent encounter for fracture with routine healing: Secondary | ICD-10-CM | POA: Diagnosis not present

## 2016-11-26 NOTE — Progress Notes (Signed)
Subjective: Deborah Ball is a 83 y.o. female patient seen today in office with complaint of painful thickened and elongated toenails and corn on right 5th toe>bottom of right foot; unable to trim. Patient is assisted by nursing aide at this visit.  Patient denies any other pedal complaints at this time.  There are no active problems to display for this patient.   Current Outpatient Prescriptions on File Prior to Visit  Medication Sig Dispense Refill  . acetaminophen (TYLENOL) 500 MG tablet Take 500 mg by mouth at bedtime as needed.    Marland Kitchen acetaminophen (TYLENOL) 650 MG CR tablet Take 650 mg by mouth every 8 (eight) hours as needed for pain.    Marland Kitchen amiodarone (PACERONE) 200 MG tablet Take 200 mg by mouth daily.    Marland Kitchen amiodarone (PACERONE) 400 MG tablet Take 400 mg by mouth.    . B Complex-C (B-COMPLEX WITH VITAMIN C) tablet Take 1 tablet by mouth daily. *cuts tablet in half to take the whole tablet total    . diltiazem (CARTIA XT) 240 MG 24 hr capsule Take 240 mg by mouth daily.     Marland Kitchen HYDROcodone-acetaminophen (NORCO/VICODIN) 5-325 MG tablet Take 0.5-1 tablets by mouth at bedtime as needed for moderate pain.    Marland Kitchen levothyroxine (SYNTHROID, LEVOTHROID) 50 MCG tablet Take 50 mcg by mouth daily before breakfast.     . mupirocin ointment (BACTROBAN) 2 % Place 1 application into the nose daily as needed (for skin wounds).    . Propylene Glycol (SYSTANE BALANCE) 0.6 % SOLN Place 1 drop into both eyes daily as needed (for dry eyes).    . rivaroxaban (XARELTO) 20 MG TABS tablet Take 20 mg by mouth daily with supper.     . vitamin B-12 (CYANOCOBALAMIN) 1000 MCG tablet Take 1,000 mcg by mouth daily. *Cuts tablet in half to take the whole tablet total    . Vitamin D, Ergocalciferol, (DRISDOL) 50000 units CAPS capsule Take 50,000 Units by mouth every 7 (seven) days. friday     No current facility-administered medications on file prior to visit.     Allergies  Allergen Reactions  . Aspirin     Other  reaction(s): OTHER  . Ciprofloxacin     Other reaction(s): OTHER  . Food     NUTS AND SEEDS == DUE TO diverticulitis  DOES NOT EAT CHOCOLATE OR TAKE CAFFEINE PRODUCTS  . Iodinated Diagnostic Agents     Tremors and shaking  . Penicillins   . Phenobarbital     Other reaction(s): UNKNOWN  . Statins     Other reaction(s): OTHER  . Sulfa Antibiotics     Other reaction(s): RASH    Objective: Physical Exam  General: Well developed, nourished, no acute distress, awake, alert and oriented x 3  Vascular: Dorsalis pedis artery 1/4 bilateral, Posterior tibial artery 0/4 bilateral due to trace edema at ankles, skin temperature warm to warm proximal to distal bilateral lower extremities, + varicosities, scant pedal hair present bilateral.  Neurological: Gross sensation present via light touch bilateral.   Dermatological: Skin is warm, dry, and supple bilateral, Nails 1-10 are tender, long, thick, and discolored with mild subungal debris, no webspace macerations present bilateral, no open lesions present bilateral, + hyperkeratotic tissue present right 5th toe and sub met 1 on right.  Resolved ecchymosis at dorsal right foot without pain. No signs of infection bilateral.  Musculoskeletal: No pain to right foot. Asymptomatic hammertoe boney deformities noted bilateral. Muscular strength within normal limits without pain on  range of motion. No pain with calf compression bilateral.  Assessment and Plan:  Problem List Items Addressed This Visit    None    Visit Diagnoses    Dermatophytosis of nail    -  Primary   Corns and callosities       Foot pain, bilateral       PVD (peripheral vascular disease) ([place])       Current use of long term anticoagulation          -Examined patient.  -Discussed treatment options for painful mycotic nails. -Mechanically debrided callus x 2 using sterile chisel blade and reduced mycotic nails with sterile nail nipper and dremel nail file without  incident. -Applied offloading padding right foot -Patient to return in 3 months for follow up evaluation or sooner if symptoms worsen.  Landis Martins, DPM

## 2016-11-30 DIAGNOSIS — Z6830 Body mass index (BMI) 30.0-30.9, adult: Secondary | ICD-10-CM | POA: Diagnosis not present

## 2016-11-30 DIAGNOSIS — M5417 Radiculopathy, lumbosacral region: Secondary | ICD-10-CM | POA: Diagnosis not present

## 2016-11-30 DIAGNOSIS — I10 Essential (primary) hypertension: Secondary | ICD-10-CM | POA: Diagnosis not present

## 2016-11-30 DIAGNOSIS — Z23 Encounter for immunization: Secondary | ICD-10-CM | POA: Diagnosis not present

## 2016-11-30 DIAGNOSIS — S92301S Fracture of unspecified metatarsal bone(s), right foot, sequela: Secondary | ICD-10-CM | POA: Diagnosis not present

## 2016-11-30 DIAGNOSIS — E785 Hyperlipidemia, unspecified: Secondary | ICD-10-CM | POA: Diagnosis not present

## 2016-11-30 DIAGNOSIS — E669 Obesity, unspecified: Secondary | ICD-10-CM | POA: Diagnosis not present

## 2016-11-30 DIAGNOSIS — E039 Hypothyroidism, unspecified: Secondary | ICD-10-CM | POA: Diagnosis not present

## 2016-11-30 DIAGNOSIS — S329XXA Fracture of unspecified parts of lumbosacral spine and pelvis, initial encounter for closed fracture: Secondary | ICD-10-CM | POA: Diagnosis not present

## 2016-11-30 DIAGNOSIS — D519 Vitamin B12 deficiency anemia, unspecified: Secondary | ICD-10-CM | POA: Diagnosis not present

## 2016-11-30 DIAGNOSIS — R296 Repeated falls: Secondary | ICD-10-CM | POA: Diagnosis not present

## 2016-11-30 DIAGNOSIS — I480 Paroxysmal atrial fibrillation: Secondary | ICD-10-CM | POA: Diagnosis not present

## 2016-11-30 DIAGNOSIS — E559 Vitamin D deficiency, unspecified: Secondary | ICD-10-CM | POA: Diagnosis not present

## 2016-12-01 DIAGNOSIS — S7001XD Contusion of right hip, subsequent encounter: Secondary | ICD-10-CM | POA: Diagnosis not present

## 2016-12-01 DIAGNOSIS — I10 Essential (primary) hypertension: Secondary | ICD-10-CM | POA: Diagnosis not present

## 2016-12-01 DIAGNOSIS — Z9181 History of falling: Secondary | ICD-10-CM | POA: Diagnosis not present

## 2016-12-01 DIAGNOSIS — W19XXXD Unspecified fall, subsequent encounter: Secondary | ICD-10-CM | POA: Diagnosis not present

## 2016-12-01 DIAGNOSIS — S32591D Other specified fracture of right pubis, subsequent encounter for fracture with routine healing: Secondary | ICD-10-CM | POA: Diagnosis not present

## 2016-12-01 DIAGNOSIS — I480 Paroxysmal atrial fibrillation: Secondary | ICD-10-CM | POA: Diagnosis not present

## 2016-12-03 DIAGNOSIS — Z9181 History of falling: Secondary | ICD-10-CM | POA: Diagnosis not present

## 2016-12-03 DIAGNOSIS — I10 Essential (primary) hypertension: Secondary | ICD-10-CM | POA: Diagnosis not present

## 2016-12-03 DIAGNOSIS — S7001XD Contusion of right hip, subsequent encounter: Secondary | ICD-10-CM | POA: Diagnosis not present

## 2016-12-03 DIAGNOSIS — W19XXXD Unspecified fall, subsequent encounter: Secondary | ICD-10-CM | POA: Diagnosis not present

## 2016-12-03 DIAGNOSIS — I480 Paroxysmal atrial fibrillation: Secondary | ICD-10-CM | POA: Diagnosis not present

## 2016-12-03 DIAGNOSIS — S32591D Other specified fracture of right pubis, subsequent encounter for fracture with routine healing: Secondary | ICD-10-CM | POA: Diagnosis not present

## 2016-12-04 DIAGNOSIS — S32591D Other specified fracture of right pubis, subsequent encounter for fracture with routine healing: Secondary | ICD-10-CM | POA: Diagnosis not present

## 2016-12-04 DIAGNOSIS — I480 Paroxysmal atrial fibrillation: Secondary | ICD-10-CM | POA: Diagnosis not present

## 2016-12-04 DIAGNOSIS — I10 Essential (primary) hypertension: Secondary | ICD-10-CM | POA: Diagnosis not present

## 2016-12-04 DIAGNOSIS — W19XXXD Unspecified fall, subsequent encounter: Secondary | ICD-10-CM | POA: Diagnosis not present

## 2016-12-04 DIAGNOSIS — Z9181 History of falling: Secondary | ICD-10-CM | POA: Diagnosis not present

## 2016-12-04 DIAGNOSIS — S7001XD Contusion of right hip, subsequent encounter: Secondary | ICD-10-CM | POA: Diagnosis not present

## 2016-12-08 DIAGNOSIS — I480 Paroxysmal atrial fibrillation: Secondary | ICD-10-CM | POA: Diagnosis not present

## 2016-12-08 DIAGNOSIS — I10 Essential (primary) hypertension: Secondary | ICD-10-CM | POA: Diagnosis not present

## 2016-12-08 DIAGNOSIS — H579 Unspecified disorder of eye and adnexa: Secondary | ICD-10-CM | POA: Diagnosis not present

## 2016-12-08 DIAGNOSIS — C50919 Malignant neoplasm of unspecified site of unspecified female breast: Secondary | ICD-10-CM | POA: Insufficient documentation

## 2016-12-08 DIAGNOSIS — S7001XD Contusion of right hip, subsequent encounter: Secondary | ICD-10-CM | POA: Diagnosis not present

## 2016-12-08 DIAGNOSIS — S32591D Other specified fracture of right pubis, subsequent encounter for fracture with routine healing: Secondary | ICD-10-CM | POA: Diagnosis not present

## 2016-12-08 DIAGNOSIS — W19XXXD Unspecified fall, subsequent encounter: Secondary | ICD-10-CM | POA: Diagnosis not present

## 2016-12-08 DIAGNOSIS — Z961 Presence of intraocular lens: Secondary | ICD-10-CM | POA: Insufficient documentation

## 2016-12-08 DIAGNOSIS — Z9181 History of falling: Secondary | ICD-10-CM | POA: Diagnosis not present

## 2016-12-09 DIAGNOSIS — I10 Essential (primary) hypertension: Secondary | ICD-10-CM | POA: Diagnosis not present

## 2016-12-09 DIAGNOSIS — W19XXXD Unspecified fall, subsequent encounter: Secondary | ICD-10-CM | POA: Diagnosis not present

## 2016-12-09 DIAGNOSIS — S32591D Other specified fracture of right pubis, subsequent encounter for fracture with routine healing: Secondary | ICD-10-CM | POA: Diagnosis not present

## 2016-12-09 DIAGNOSIS — I480 Paroxysmal atrial fibrillation: Secondary | ICD-10-CM | POA: Diagnosis not present

## 2016-12-09 DIAGNOSIS — M810 Age-related osteoporosis without current pathological fracture: Secondary | ICD-10-CM | POA: Diagnosis not present

## 2016-12-09 DIAGNOSIS — S7001XD Contusion of right hip, subsequent encounter: Secondary | ICD-10-CM | POA: Diagnosis not present

## 2016-12-09 DIAGNOSIS — Z9181 History of falling: Secondary | ICD-10-CM | POA: Diagnosis not present

## 2016-12-10 DIAGNOSIS — S32591D Other specified fracture of right pubis, subsequent encounter for fracture with routine healing: Secondary | ICD-10-CM | POA: Diagnosis not present

## 2016-12-10 DIAGNOSIS — I480 Paroxysmal atrial fibrillation: Secondary | ICD-10-CM | POA: Diagnosis not present

## 2016-12-10 DIAGNOSIS — Z9181 History of falling: Secondary | ICD-10-CM | POA: Diagnosis not present

## 2016-12-10 DIAGNOSIS — S7001XD Contusion of right hip, subsequent encounter: Secondary | ICD-10-CM | POA: Diagnosis not present

## 2016-12-10 DIAGNOSIS — I10 Essential (primary) hypertension: Secondary | ICD-10-CM | POA: Diagnosis not present

## 2016-12-10 DIAGNOSIS — W19XXXD Unspecified fall, subsequent encounter: Secondary | ICD-10-CM | POA: Diagnosis not present

## 2016-12-11 DIAGNOSIS — L0390 Cellulitis, unspecified: Secondary | ICD-10-CM | POA: Diagnosis not present

## 2016-12-14 DIAGNOSIS — S7001XD Contusion of right hip, subsequent encounter: Secondary | ICD-10-CM | POA: Diagnosis not present

## 2016-12-14 DIAGNOSIS — I480 Paroxysmal atrial fibrillation: Secondary | ICD-10-CM | POA: Diagnosis not present

## 2016-12-14 DIAGNOSIS — I10 Essential (primary) hypertension: Secondary | ICD-10-CM | POA: Diagnosis not present

## 2016-12-14 DIAGNOSIS — W19XXXD Unspecified fall, subsequent encounter: Secondary | ICD-10-CM | POA: Diagnosis not present

## 2016-12-14 DIAGNOSIS — Z9181 History of falling: Secondary | ICD-10-CM | POA: Diagnosis not present

## 2016-12-14 DIAGNOSIS — S32591D Other specified fracture of right pubis, subsequent encounter for fracture with routine healing: Secondary | ICD-10-CM | POA: Diagnosis not present

## 2016-12-15 DIAGNOSIS — I10 Essential (primary) hypertension: Secondary | ICD-10-CM | POA: Diagnosis not present

## 2016-12-15 DIAGNOSIS — S7001XD Contusion of right hip, subsequent encounter: Secondary | ICD-10-CM | POA: Diagnosis not present

## 2016-12-15 DIAGNOSIS — S32591D Other specified fracture of right pubis, subsequent encounter for fracture with routine healing: Secondary | ICD-10-CM | POA: Diagnosis not present

## 2016-12-15 DIAGNOSIS — I480 Paroxysmal atrial fibrillation: Secondary | ICD-10-CM | POA: Diagnosis not present

## 2016-12-15 DIAGNOSIS — W19XXXD Unspecified fall, subsequent encounter: Secondary | ICD-10-CM | POA: Diagnosis not present

## 2016-12-15 DIAGNOSIS — Z9181 History of falling: Secondary | ICD-10-CM | POA: Diagnosis not present

## 2016-12-21 DIAGNOSIS — W19XXXD Unspecified fall, subsequent encounter: Secondary | ICD-10-CM | POA: Diagnosis not present

## 2016-12-21 DIAGNOSIS — Z9181 History of falling: Secondary | ICD-10-CM | POA: Diagnosis not present

## 2016-12-21 DIAGNOSIS — S7001XD Contusion of right hip, subsequent encounter: Secondary | ICD-10-CM | POA: Diagnosis not present

## 2016-12-21 DIAGNOSIS — I10 Essential (primary) hypertension: Secondary | ICD-10-CM | POA: Diagnosis not present

## 2016-12-21 DIAGNOSIS — I480 Paroxysmal atrial fibrillation: Secondary | ICD-10-CM | POA: Diagnosis not present

## 2016-12-21 DIAGNOSIS — L0390 Cellulitis, unspecified: Secondary | ICD-10-CM | POA: Diagnosis not present

## 2016-12-21 DIAGNOSIS — S32591D Other specified fracture of right pubis, subsequent encounter for fracture with routine healing: Secondary | ICD-10-CM | POA: Diagnosis not present

## 2016-12-22 DIAGNOSIS — S7001XD Contusion of right hip, subsequent encounter: Secondary | ICD-10-CM | POA: Diagnosis not present

## 2016-12-22 DIAGNOSIS — W19XXXD Unspecified fall, subsequent encounter: Secondary | ICD-10-CM | POA: Diagnosis not present

## 2016-12-22 DIAGNOSIS — S32591D Other specified fracture of right pubis, subsequent encounter for fracture with routine healing: Secondary | ICD-10-CM | POA: Diagnosis not present

## 2016-12-22 DIAGNOSIS — I480 Paroxysmal atrial fibrillation: Secondary | ICD-10-CM | POA: Diagnosis not present

## 2016-12-22 DIAGNOSIS — I10 Essential (primary) hypertension: Secondary | ICD-10-CM | POA: Diagnosis not present

## 2016-12-22 DIAGNOSIS — Z9181 History of falling: Secondary | ICD-10-CM | POA: Diagnosis not present

## 2016-12-23 DIAGNOSIS — S7001XD Contusion of right hip, subsequent encounter: Secondary | ICD-10-CM | POA: Diagnosis not present

## 2016-12-23 DIAGNOSIS — I480 Paroxysmal atrial fibrillation: Secondary | ICD-10-CM | POA: Diagnosis not present

## 2016-12-23 DIAGNOSIS — W19XXXD Unspecified fall, subsequent encounter: Secondary | ICD-10-CM | POA: Diagnosis not present

## 2016-12-23 DIAGNOSIS — Z9181 History of falling: Secondary | ICD-10-CM | POA: Diagnosis not present

## 2016-12-23 DIAGNOSIS — S32591D Other specified fracture of right pubis, subsequent encounter for fracture with routine healing: Secondary | ICD-10-CM | POA: Diagnosis not present

## 2016-12-23 DIAGNOSIS — I10 Essential (primary) hypertension: Secondary | ICD-10-CM | POA: Diagnosis not present

## 2016-12-24 DIAGNOSIS — W19XXXD Unspecified fall, subsequent encounter: Secondary | ICD-10-CM | POA: Diagnosis not present

## 2016-12-24 DIAGNOSIS — I480 Paroxysmal atrial fibrillation: Secondary | ICD-10-CM | POA: Diagnosis not present

## 2016-12-24 DIAGNOSIS — I10 Essential (primary) hypertension: Secondary | ICD-10-CM | POA: Diagnosis not present

## 2016-12-24 DIAGNOSIS — S32591D Other specified fracture of right pubis, subsequent encounter for fracture with routine healing: Secondary | ICD-10-CM | POA: Diagnosis not present

## 2016-12-24 DIAGNOSIS — Z9181 History of falling: Secondary | ICD-10-CM | POA: Diagnosis not present

## 2016-12-24 DIAGNOSIS — S7001XD Contusion of right hip, subsequent encounter: Secondary | ICD-10-CM | POA: Diagnosis not present

## 2016-12-25 DIAGNOSIS — L0390 Cellulitis, unspecified: Secondary | ICD-10-CM | POA: Diagnosis not present

## 2016-12-25 DIAGNOSIS — L570 Actinic keratosis: Secondary | ICD-10-CM | POA: Diagnosis not present

## 2016-12-28 DIAGNOSIS — W19XXXD Unspecified fall, subsequent encounter: Secondary | ICD-10-CM | POA: Diagnosis not present

## 2016-12-28 DIAGNOSIS — E782 Mixed hyperlipidemia: Secondary | ICD-10-CM | POA: Diagnosis not present

## 2016-12-28 DIAGNOSIS — Z9181 History of falling: Secondary | ICD-10-CM | POA: Diagnosis not present

## 2016-12-28 DIAGNOSIS — I480 Paroxysmal atrial fibrillation: Secondary | ICD-10-CM | POA: Diagnosis not present

## 2016-12-28 DIAGNOSIS — S7001XD Contusion of right hip, subsequent encounter: Secondary | ICD-10-CM | POA: Diagnosis not present

## 2016-12-28 DIAGNOSIS — I10 Essential (primary) hypertension: Secondary | ICD-10-CM | POA: Diagnosis not present

## 2016-12-28 DIAGNOSIS — S32591D Other specified fracture of right pubis, subsequent encounter for fracture with routine healing: Secondary | ICD-10-CM | POA: Diagnosis not present

## 2016-12-29 DIAGNOSIS — W19XXXD Unspecified fall, subsequent encounter: Secondary | ICD-10-CM | POA: Diagnosis not present

## 2016-12-29 DIAGNOSIS — Z9181 History of falling: Secondary | ICD-10-CM | POA: Diagnosis not present

## 2016-12-29 DIAGNOSIS — S32591D Other specified fracture of right pubis, subsequent encounter for fracture with routine healing: Secondary | ICD-10-CM | POA: Diagnosis not present

## 2016-12-29 DIAGNOSIS — I480 Paroxysmal atrial fibrillation: Secondary | ICD-10-CM | POA: Diagnosis not present

## 2016-12-29 DIAGNOSIS — I10 Essential (primary) hypertension: Secondary | ICD-10-CM | POA: Diagnosis not present

## 2016-12-29 DIAGNOSIS — S7001XD Contusion of right hip, subsequent encounter: Secondary | ICD-10-CM | POA: Diagnosis not present

## 2016-12-30 DIAGNOSIS — W19XXXD Unspecified fall, subsequent encounter: Secondary | ICD-10-CM | POA: Diagnosis not present

## 2016-12-30 DIAGNOSIS — Z9181 History of falling: Secondary | ICD-10-CM | POA: Diagnosis not present

## 2016-12-30 DIAGNOSIS — S32591D Other specified fracture of right pubis, subsequent encounter for fracture with routine healing: Secondary | ICD-10-CM | POA: Diagnosis not present

## 2016-12-30 DIAGNOSIS — I10 Essential (primary) hypertension: Secondary | ICD-10-CM | POA: Diagnosis not present

## 2016-12-30 DIAGNOSIS — I480 Paroxysmal atrial fibrillation: Secondary | ICD-10-CM | POA: Diagnosis not present

## 2016-12-30 DIAGNOSIS — S7001XD Contusion of right hip, subsequent encounter: Secondary | ICD-10-CM | POA: Diagnosis not present

## 2016-12-31 DIAGNOSIS — Z9181 History of falling: Secondary | ICD-10-CM | POA: Diagnosis not present

## 2016-12-31 DIAGNOSIS — W19XXXD Unspecified fall, subsequent encounter: Secondary | ICD-10-CM | POA: Diagnosis not present

## 2016-12-31 DIAGNOSIS — S7001XD Contusion of right hip, subsequent encounter: Secondary | ICD-10-CM | POA: Diagnosis not present

## 2016-12-31 DIAGNOSIS — I10 Essential (primary) hypertension: Secondary | ICD-10-CM | POA: Diagnosis not present

## 2016-12-31 DIAGNOSIS — I480 Paroxysmal atrial fibrillation: Secondary | ICD-10-CM | POA: Diagnosis not present

## 2016-12-31 DIAGNOSIS — S32591D Other specified fracture of right pubis, subsequent encounter for fracture with routine healing: Secondary | ICD-10-CM | POA: Diagnosis not present

## 2017-01-06 DIAGNOSIS — Z9181 History of falling: Secondary | ICD-10-CM | POA: Diagnosis not present

## 2017-01-06 DIAGNOSIS — S32591D Other specified fracture of right pubis, subsequent encounter for fracture with routine healing: Secondary | ICD-10-CM | POA: Diagnosis not present

## 2017-01-06 DIAGNOSIS — I480 Paroxysmal atrial fibrillation: Secondary | ICD-10-CM | POA: Diagnosis not present

## 2017-01-06 DIAGNOSIS — S7001XD Contusion of right hip, subsequent encounter: Secondary | ICD-10-CM | POA: Diagnosis not present

## 2017-01-06 DIAGNOSIS — I10 Essential (primary) hypertension: Secondary | ICD-10-CM | POA: Diagnosis not present

## 2017-01-06 DIAGNOSIS — W19XXXD Unspecified fall, subsequent encounter: Secondary | ICD-10-CM | POA: Diagnosis not present

## 2017-01-07 DIAGNOSIS — L821 Other seborrheic keratosis: Secondary | ICD-10-CM | POA: Diagnosis not present

## 2017-01-07 DIAGNOSIS — L0390 Cellulitis, unspecified: Secondary | ICD-10-CM | POA: Diagnosis not present

## 2017-01-07 DIAGNOSIS — I480 Paroxysmal atrial fibrillation: Secondary | ICD-10-CM | POA: Diagnosis not present

## 2017-01-07 DIAGNOSIS — I10 Essential (primary) hypertension: Secondary | ICD-10-CM | POA: Diagnosis not present

## 2017-01-07 DIAGNOSIS — W19XXXD Unspecified fall, subsequent encounter: Secondary | ICD-10-CM | POA: Diagnosis not present

## 2017-01-07 DIAGNOSIS — L570 Actinic keratosis: Secondary | ICD-10-CM | POA: Diagnosis not present

## 2017-01-07 DIAGNOSIS — Z9181 History of falling: Secondary | ICD-10-CM | POA: Diagnosis not present

## 2017-01-07 DIAGNOSIS — S32591D Other specified fracture of right pubis, subsequent encounter for fracture with routine healing: Secondary | ICD-10-CM | POA: Diagnosis not present

## 2017-01-07 DIAGNOSIS — L578 Other skin changes due to chronic exposure to nonionizing radiation: Secondary | ICD-10-CM | POA: Diagnosis not present

## 2017-01-07 DIAGNOSIS — S7001XD Contusion of right hip, subsequent encounter: Secondary | ICD-10-CM | POA: Diagnosis not present

## 2017-01-11 DIAGNOSIS — S7001XD Contusion of right hip, subsequent encounter: Secondary | ICD-10-CM | POA: Diagnosis not present

## 2017-01-11 DIAGNOSIS — I10 Essential (primary) hypertension: Secondary | ICD-10-CM | POA: Diagnosis not present

## 2017-01-11 DIAGNOSIS — Z9181 History of falling: Secondary | ICD-10-CM | POA: Diagnosis not present

## 2017-01-11 DIAGNOSIS — W19XXXD Unspecified fall, subsequent encounter: Secondary | ICD-10-CM | POA: Diagnosis not present

## 2017-01-11 DIAGNOSIS — S32591D Other specified fracture of right pubis, subsequent encounter for fracture with routine healing: Secondary | ICD-10-CM | POA: Diagnosis not present

## 2017-01-11 DIAGNOSIS — I480 Paroxysmal atrial fibrillation: Secondary | ICD-10-CM | POA: Diagnosis not present

## 2017-01-12 DIAGNOSIS — I10 Essential (primary) hypertension: Secondary | ICD-10-CM | POA: Diagnosis not present

## 2017-01-12 DIAGNOSIS — Z9181 History of falling: Secondary | ICD-10-CM | POA: Diagnosis not present

## 2017-01-12 DIAGNOSIS — I480 Paroxysmal atrial fibrillation: Secondary | ICD-10-CM | POA: Diagnosis not present

## 2017-01-12 DIAGNOSIS — S7001XD Contusion of right hip, subsequent encounter: Secondary | ICD-10-CM | POA: Diagnosis not present

## 2017-01-12 DIAGNOSIS — S32591D Other specified fracture of right pubis, subsequent encounter for fracture with routine healing: Secondary | ICD-10-CM | POA: Diagnosis not present

## 2017-01-12 DIAGNOSIS — W19XXXD Unspecified fall, subsequent encounter: Secondary | ICD-10-CM | POA: Diagnosis not present

## 2017-01-15 DIAGNOSIS — Z9181 History of falling: Secondary | ICD-10-CM | POA: Diagnosis not present

## 2017-01-15 DIAGNOSIS — S32591D Other specified fracture of right pubis, subsequent encounter for fracture with routine healing: Secondary | ICD-10-CM | POA: Diagnosis not present

## 2017-01-15 DIAGNOSIS — I480 Paroxysmal atrial fibrillation: Secondary | ICD-10-CM | POA: Diagnosis not present

## 2017-01-15 DIAGNOSIS — W19XXXD Unspecified fall, subsequent encounter: Secondary | ICD-10-CM | POA: Diagnosis not present

## 2017-01-15 DIAGNOSIS — S7001XD Contusion of right hip, subsequent encounter: Secondary | ICD-10-CM | POA: Diagnosis not present

## 2017-01-15 DIAGNOSIS — I10 Essential (primary) hypertension: Secondary | ICD-10-CM | POA: Diagnosis not present

## 2017-02-02 DIAGNOSIS — J209 Acute bronchitis, unspecified: Secondary | ICD-10-CM | POA: Diagnosis not present

## 2017-02-02 DIAGNOSIS — I4891 Unspecified atrial fibrillation: Secondary | ICD-10-CM | POA: Diagnosis not present

## 2017-02-07 DIAGNOSIS — Z888 Allergy status to other drugs, medicaments and biological substances status: Secondary | ICD-10-CM | POA: Diagnosis not present

## 2017-02-07 DIAGNOSIS — Z7901 Long term (current) use of anticoagulants: Secondary | ICD-10-CM | POA: Diagnosis not present

## 2017-02-07 DIAGNOSIS — I482 Chronic atrial fibrillation: Secondary | ICD-10-CM | POA: Diagnosis present

## 2017-02-07 DIAGNOSIS — I4891 Unspecified atrial fibrillation: Secondary | ICD-10-CM | POA: Diagnosis not present

## 2017-02-07 DIAGNOSIS — I5033 Acute on chronic diastolic (congestive) heart failure: Secondary | ICD-10-CM | POA: Diagnosis not present

## 2017-02-07 DIAGNOSIS — I5031 Acute diastolic (congestive) heart failure: Secondary | ICD-10-CM | POA: Diagnosis not present

## 2017-02-07 DIAGNOSIS — E876 Hypokalemia: Secondary | ICD-10-CM | POA: Diagnosis present

## 2017-02-07 DIAGNOSIS — Z79899 Other long term (current) drug therapy: Secondary | ICD-10-CM | POA: Diagnosis not present

## 2017-02-07 DIAGNOSIS — J90 Pleural effusion, not elsewhere classified: Secondary | ICD-10-CM | POA: Diagnosis not present

## 2017-02-07 DIAGNOSIS — Z881 Allergy status to other antibiotic agents status: Secondary | ICD-10-CM | POA: Diagnosis not present

## 2017-02-07 DIAGNOSIS — Z853 Personal history of malignant neoplasm of breast: Secondary | ICD-10-CM | POA: Diagnosis not present

## 2017-02-07 DIAGNOSIS — I110 Hypertensive heart disease with heart failure: Secondary | ICD-10-CM | POA: Diagnosis present

## 2017-02-07 DIAGNOSIS — Z880 Allergy status to penicillin: Secondary | ICD-10-CM | POA: Diagnosis not present

## 2017-02-07 DIAGNOSIS — I361 Nonrheumatic tricuspid (valve) insufficiency: Secondary | ICD-10-CM | POA: Diagnosis not present

## 2017-02-07 DIAGNOSIS — I340 Nonrheumatic mitral (valve) insufficiency: Secondary | ICD-10-CM | POA: Diagnosis not present

## 2017-02-07 DIAGNOSIS — E039 Hypothyroidism, unspecified: Secondary | ICD-10-CM | POA: Diagnosis present

## 2017-02-07 DIAGNOSIS — I517 Cardiomegaly: Secondary | ICD-10-CM | POA: Diagnosis not present

## 2017-02-07 DIAGNOSIS — S72001D Fracture of unspecified part of neck of right femur, subsequent encounter for closed fracture with routine healing: Secondary | ICD-10-CM | POA: Diagnosis not present

## 2017-02-07 DIAGNOSIS — R0602 Shortness of breath: Secondary | ICD-10-CM | POA: Diagnosis not present

## 2017-02-07 DIAGNOSIS — R918 Other nonspecific abnormal finding of lung field: Secondary | ICD-10-CM | POA: Diagnosis not present

## 2017-02-07 DIAGNOSIS — J189 Pneumonia, unspecified organism: Secondary | ICD-10-CM | POA: Diagnosis not present

## 2017-02-07 DIAGNOSIS — R296 Repeated falls: Secondary | ICD-10-CM | POA: Diagnosis present

## 2017-02-07 DIAGNOSIS — R262 Difficulty in walking, not elsewhere classified: Secondary | ICD-10-CM | POA: Diagnosis not present

## 2017-02-07 DIAGNOSIS — I10 Essential (primary) hypertension: Secondary | ICD-10-CM | POA: Diagnosis not present

## 2017-02-07 DIAGNOSIS — Z886 Allergy status to analgesic agent status: Secondary | ICD-10-CM | POA: Diagnosis not present

## 2017-02-07 DIAGNOSIS — Z91041 Radiographic dye allergy status: Secondary | ICD-10-CM | POA: Diagnosis not present

## 2017-02-07 DIAGNOSIS — Z85840 Personal history of malignant neoplasm of eye: Secondary | ICD-10-CM | POA: Diagnosis not present

## 2017-02-07 DIAGNOSIS — R0600 Dyspnea, unspecified: Secondary | ICD-10-CM | POA: Diagnosis not present

## 2017-02-07 DIAGNOSIS — D638 Anemia in other chronic diseases classified elsewhere: Secondary | ICD-10-CM | POA: Diagnosis not present

## 2017-02-07 DIAGNOSIS — Z882 Allergy status to sulfonamides status: Secondary | ICD-10-CM | POA: Diagnosis not present

## 2017-02-07 DIAGNOSIS — R05 Cough: Secondary | ICD-10-CM | POA: Diagnosis not present

## 2017-02-07 DIAGNOSIS — R0902 Hypoxemia: Secondary | ICD-10-CM | POA: Diagnosis present

## 2017-02-11 DIAGNOSIS — R2689 Other abnormalities of gait and mobility: Secondary | ICD-10-CM | POA: Diagnosis not present

## 2017-02-11 DIAGNOSIS — I130 Hypertensive heart and chronic kidney disease with heart failure and stage 1 through stage 4 chronic kidney disease, or unspecified chronic kidney disease: Secondary | ICD-10-CM | POA: Diagnosis not present

## 2017-02-11 DIAGNOSIS — S329XXS Fracture of unspecified parts of lumbosacral spine and pelvis, sequela: Secondary | ICD-10-CM | POA: Diagnosis not present

## 2017-02-11 DIAGNOSIS — N189 Chronic kidney disease, unspecified: Secondary | ICD-10-CM | POA: Diagnosis not present

## 2017-02-11 DIAGNOSIS — I5033 Acute on chronic diastolic (congestive) heart failure: Secondary | ICD-10-CM | POA: Diagnosis not present

## 2017-02-11 DIAGNOSIS — I480 Paroxysmal atrial fibrillation: Secondary | ICD-10-CM | POA: Diagnosis not present

## 2017-02-12 DIAGNOSIS — I5033 Acute on chronic diastolic (congestive) heart failure: Secondary | ICD-10-CM | POA: Diagnosis not present

## 2017-02-12 DIAGNOSIS — R05 Cough: Secondary | ICD-10-CM | POA: Diagnosis not present

## 2017-02-12 DIAGNOSIS — R21 Rash and other nonspecific skin eruption: Secondary | ICD-10-CM | POA: Diagnosis not present

## 2017-02-15 DIAGNOSIS — I130 Hypertensive heart and chronic kidney disease with heart failure and stage 1 through stage 4 chronic kidney disease, or unspecified chronic kidney disease: Secondary | ICD-10-CM | POA: Diagnosis not present

## 2017-02-15 DIAGNOSIS — I5033 Acute on chronic diastolic (congestive) heart failure: Secondary | ICD-10-CM | POA: Diagnosis not present

## 2017-02-15 DIAGNOSIS — I480 Paroxysmal atrial fibrillation: Secondary | ICD-10-CM | POA: Diagnosis not present

## 2017-02-15 DIAGNOSIS — S329XXS Fracture of unspecified parts of lumbosacral spine and pelvis, sequela: Secondary | ICD-10-CM | POA: Diagnosis not present

## 2017-02-15 DIAGNOSIS — N189 Chronic kidney disease, unspecified: Secondary | ICD-10-CM | POA: Diagnosis not present

## 2017-02-15 DIAGNOSIS — R2689 Other abnormalities of gait and mobility: Secondary | ICD-10-CM | POA: Diagnosis not present

## 2017-02-16 DIAGNOSIS — I130 Hypertensive heart and chronic kidney disease with heart failure and stage 1 through stage 4 chronic kidney disease, or unspecified chronic kidney disease: Secondary | ICD-10-CM | POA: Diagnosis not present

## 2017-02-16 DIAGNOSIS — N189 Chronic kidney disease, unspecified: Secondary | ICD-10-CM | POA: Diagnosis not present

## 2017-02-16 DIAGNOSIS — S329XXS Fracture of unspecified parts of lumbosacral spine and pelvis, sequela: Secondary | ICD-10-CM | POA: Diagnosis not present

## 2017-02-16 DIAGNOSIS — I5033 Acute on chronic diastolic (congestive) heart failure: Secondary | ICD-10-CM | POA: Diagnosis not present

## 2017-02-16 DIAGNOSIS — R2689 Other abnormalities of gait and mobility: Secondary | ICD-10-CM | POA: Diagnosis not present

## 2017-02-16 DIAGNOSIS — I480 Paroxysmal atrial fibrillation: Secondary | ICD-10-CM | POA: Diagnosis not present

## 2017-02-17 DIAGNOSIS — I130 Hypertensive heart and chronic kidney disease with heart failure and stage 1 through stage 4 chronic kidney disease, or unspecified chronic kidney disease: Secondary | ICD-10-CM | POA: Diagnosis not present

## 2017-02-17 DIAGNOSIS — N189 Chronic kidney disease, unspecified: Secondary | ICD-10-CM | POA: Diagnosis not present

## 2017-02-17 DIAGNOSIS — I480 Paroxysmal atrial fibrillation: Secondary | ICD-10-CM | POA: Diagnosis not present

## 2017-02-17 DIAGNOSIS — R2689 Other abnormalities of gait and mobility: Secondary | ICD-10-CM | POA: Diagnosis not present

## 2017-02-17 DIAGNOSIS — I5033 Acute on chronic diastolic (congestive) heart failure: Secondary | ICD-10-CM | POA: Diagnosis not present

## 2017-02-17 DIAGNOSIS — S329XXS Fracture of unspecified parts of lumbosacral spine and pelvis, sequela: Secondary | ICD-10-CM | POA: Diagnosis not present

## 2017-02-19 DIAGNOSIS — I130 Hypertensive heart and chronic kidney disease with heart failure and stage 1 through stage 4 chronic kidney disease, or unspecified chronic kidney disease: Secondary | ICD-10-CM | POA: Diagnosis not present

## 2017-02-19 DIAGNOSIS — I5033 Acute on chronic diastolic (congestive) heart failure: Secondary | ICD-10-CM | POA: Diagnosis not present

## 2017-02-19 DIAGNOSIS — N189 Chronic kidney disease, unspecified: Secondary | ICD-10-CM | POA: Diagnosis not present

## 2017-02-19 DIAGNOSIS — I480 Paroxysmal atrial fibrillation: Secondary | ICD-10-CM | POA: Diagnosis not present

## 2017-02-19 DIAGNOSIS — S329XXS Fracture of unspecified parts of lumbosacral spine and pelvis, sequela: Secondary | ICD-10-CM | POA: Diagnosis not present

## 2017-02-19 DIAGNOSIS — R2689 Other abnormalities of gait and mobility: Secondary | ICD-10-CM | POA: Diagnosis not present

## 2017-02-24 ENCOUNTER — Encounter: Payer: Self-pay | Admitting: Sports Medicine

## 2017-02-24 ENCOUNTER — Telehealth: Payer: Self-pay | Admitting: Sports Medicine

## 2017-02-24 ENCOUNTER — Ambulatory Visit (INDEPENDENT_AMBULATORY_CARE_PROVIDER_SITE_OTHER): Payer: Medicare Other | Admitting: Sports Medicine

## 2017-02-24 DIAGNOSIS — L84 Corns and callosities: Secondary | ICD-10-CM | POA: Diagnosis not present

## 2017-02-24 DIAGNOSIS — M810 Age-related osteoporosis without current pathological fracture: Secondary | ICD-10-CM | POA: Diagnosis not present

## 2017-02-24 DIAGNOSIS — C44629 Squamous cell carcinoma of skin of left upper limb, including shoulder: Secondary | ICD-10-CM | POA: Diagnosis not present

## 2017-02-24 DIAGNOSIS — M79671 Pain in right foot: Secondary | ICD-10-CM

## 2017-02-24 DIAGNOSIS — Z7901 Long term (current) use of anticoagulants: Secondary | ICD-10-CM

## 2017-02-24 DIAGNOSIS — M65321 Trigger finger, right index finger: Secondary | ICD-10-CM | POA: Diagnosis not present

## 2017-02-24 DIAGNOSIS — M79672 Pain in left foot: Secondary | ICD-10-CM

## 2017-02-24 DIAGNOSIS — B351 Tinea unguium: Secondary | ICD-10-CM

## 2017-02-24 DIAGNOSIS — L820 Inflamed seborrheic keratosis: Secondary | ICD-10-CM | POA: Diagnosis not present

## 2017-02-24 DIAGNOSIS — I739 Peripheral vascular disease, unspecified: Secondary | ICD-10-CM

## 2017-02-24 DIAGNOSIS — L570 Actinic keratosis: Secondary | ICD-10-CM | POA: Diagnosis not present

## 2017-02-24 DIAGNOSIS — S32591D Other specified fracture of right pubis, subsequent encounter for fracture with routine healing: Secondary | ICD-10-CM | POA: Diagnosis not present

## 2017-02-24 NOTE — Telephone Encounter (Signed)
I'm calling for my mom and she was just there to see Dr. Cannon Kettle. I just needed to leave the medication amount. The potassium fluoride extended release is 10 n or m eq tablets and her lasix was 20 mg tablet 1 a day. I just wanted to let you know that so you can have it on your records. Thank you.

## 2017-02-24 NOTE — Progress Notes (Signed)
Subjective: Mckaila Duffus is a 84 y.o. female patient seen today in office with complaint of mildly painful thickened and elongated toenails and moderately painful corn on bottom of right foot>5th toe on right; unable to trim. Patient is assisted by daughter at this visit and states that mom had heart issues and was given lasix and potassium. Patient denies any other pedal complaints at this time.  There are no active problems to display for this patient.    Current Outpatient Medications:  .  furosemide (LASIX) 20 MG tablet, Take 20 mg by mouth., Disp: , Rfl:  .  potassium chloride (K-DUR,KLOR-CON) 10 MEQ tablet, Take 10 mEq by mouth 2 (two) times daily., Disp: , Rfl:  .  acetaminophen (TYLENOL) 500 MG tablet, Take 500 mg by mouth at bedtime as needed., Disp: , Rfl:  .  acetaminophen (TYLENOL) 650 MG CR tablet, Take 650 mg by mouth every 8 (eight) hours as needed for pain., Disp: , Rfl:  .  amiodarone (PACERONE) 200 MG tablet, Take 200 mg by mouth daily., Disp: , Rfl:  .  amiodarone (PACERONE) 400 MG tablet, Take 400 mg by mouth., Disp: , Rfl:  .  B Complex-C (B-COMPLEX WITH VITAMIN C) tablet, Take 1 tablet by mouth daily. *cuts tablet in half to take the whole tablet total, Disp: , Rfl:  .  diltiazem (CARTIA XT) 240 MG 24 hr capsule, Take 240 mg by mouth daily. , Disp: , Rfl:  .  HYDROcodone-acetaminophen (NORCO/VICODIN) 5-325 MG tablet, Take 0.5-1 tablets by mouth at bedtime as needed for moderate pain., Disp: , Rfl:  .  levothyroxine (SYNTHROID, LEVOTHROID) 50 MCG tablet, Take 50 mcg by mouth daily before breakfast. , Disp: , Rfl:  .  mupirocin ointment (BACTROBAN) 2 %, Place 1 application into the nose daily as needed (for skin wounds)., Disp: , Rfl:  .  Propylene Glycol (SYSTANE BALANCE) 0.6 % SOLN, Place 1 drop into both eyes daily as needed (for dry eyes)., Disp: , Rfl:  .  rivaroxaban (XARELTO) 20 MG TABS tablet, Take 20 mg by mouth daily with supper. , Disp: , Rfl:  .  vitamin B-12  (CYANOCOBALAMIN) 1000 MCG tablet, Take 1,000 mcg by mouth daily. *Cuts tablet in half to take the whole tablet total, Disp: , Rfl:  .  Vitamin D, Ergocalciferol, (DRISDOL) 50000 units CAPS capsule, Take 50,000 Units by mouth every 7 (seven) days. friday, Disp: , Rfl:   Allergies  Allergen Reactions  . Aspirin     Other reaction(s): OTHER  . Ciprofloxacin     Other reaction(s): OTHER  . Food     NUTS AND SEEDS == DUE TO diverticulitis  DOES NOT EAT CHOCOLATE OR TAKE CAFFEINE PRODUCTS  . Iodinated Diagnostic Agents     Tremors and shaking  . Penicillins   . Phenobarbital     Other reaction(s): UNKNOWN  . Statins     Other reaction(s): OTHER  . Sulfa Antibiotics     Other reaction(s): RASH    Objective: Physical Exam  General: Well developed, nourished, no acute distress, awake, alert and oriented x 3  Vascular: Dorsalis pedis artery 1/4 bilateral, Posterior tibial artery 0/4 bilateral due to trace edema at ankles, skin temperature warm to warm proximal to distal bilateral lower extremities, + varicosities, scant pedal hair present bilateral.  Neurological: Gross sensation present via light touch bilateral.   Dermatological: Skin is warm, dry, and supple bilateral, Nails 1-10 are tender, long, thick, and discolored with mild subungal debris, no webspace  macerations present bilateral, no open lesions present bilateral, + hyperkeratotic tissue present right 5th toe and sub met 1 on right. No signs of infection bilateral.  Musculoskeletal: Mild pain to callus areas on right foot. Asymptomatic hammertoe boney deformities noted bilateral. Muscular strength within normal limits without pain on range of motion. No pain with calf compression bilateral.  Assessment and Plan:  Problem List Items Addressed This Visit    None    Visit Diagnoses    Dermatophytosis of nail    -  Primary   Corns and callosities       Foot pain, bilateral       PVD (peripheral vascular disease) ([place])        Current use of long term anticoagulation          -Examined patient.  -Discussed treatment options for painful mycotic nails and callus. -Mechanically debrided callus x 2 using sterile chisel blade and reduced mycotic nails with sterile nail nipper and dremel nail file without incident. -Recommend continue with good supportive shoes  -Patient to return in 3 months for follow up evaluation or sooner if symptoms worsen.  Landis Martins, DPM

## 2017-02-24 NOTE — Telephone Encounter (Signed)
Ok got it. Mel I added those medications -Dr. Chauncey Cruel

## 2017-03-02 DIAGNOSIS — I130 Hypertensive heart and chronic kidney disease with heart failure and stage 1 through stage 4 chronic kidney disease, or unspecified chronic kidney disease: Secondary | ICD-10-CM | POA: Diagnosis not present

## 2017-03-02 DIAGNOSIS — N189 Chronic kidney disease, unspecified: Secondary | ICD-10-CM | POA: Diagnosis not present

## 2017-03-02 DIAGNOSIS — M810 Age-related osteoporosis without current pathological fracture: Secondary | ICD-10-CM | POA: Diagnosis not present

## 2017-03-02 DIAGNOSIS — M130 Polyarthritis, unspecified: Secondary | ICD-10-CM | POA: Diagnosis not present

## 2017-03-02 DIAGNOSIS — I480 Paroxysmal atrial fibrillation: Secondary | ICD-10-CM | POA: Diagnosis not present

## 2017-03-02 DIAGNOSIS — I5033 Acute on chronic diastolic (congestive) heart failure: Secondary | ICD-10-CM | POA: Diagnosis not present

## 2017-03-03 DIAGNOSIS — I480 Paroxysmal atrial fibrillation: Secondary | ICD-10-CM | POA: Diagnosis not present

## 2017-03-03 DIAGNOSIS — I5033 Acute on chronic diastolic (congestive) heart failure: Secondary | ICD-10-CM | POA: Diagnosis not present

## 2017-03-03 DIAGNOSIS — M130 Polyarthritis, unspecified: Secondary | ICD-10-CM | POA: Diagnosis not present

## 2017-03-03 DIAGNOSIS — M810 Age-related osteoporosis without current pathological fracture: Secondary | ICD-10-CM | POA: Diagnosis not present

## 2017-03-03 DIAGNOSIS — I130 Hypertensive heart and chronic kidney disease with heart failure and stage 1 through stage 4 chronic kidney disease, or unspecified chronic kidney disease: Secondary | ICD-10-CM | POA: Diagnosis not present

## 2017-03-03 DIAGNOSIS — N189 Chronic kidney disease, unspecified: Secondary | ICD-10-CM | POA: Diagnosis not present

## 2017-03-04 DIAGNOSIS — M130 Polyarthritis, unspecified: Secondary | ICD-10-CM | POA: Diagnosis not present

## 2017-03-04 DIAGNOSIS — N189 Chronic kidney disease, unspecified: Secondary | ICD-10-CM | POA: Diagnosis not present

## 2017-03-04 DIAGNOSIS — M810 Age-related osteoporosis without current pathological fracture: Secondary | ICD-10-CM | POA: Diagnosis not present

## 2017-03-04 DIAGNOSIS — I130 Hypertensive heart and chronic kidney disease with heart failure and stage 1 through stage 4 chronic kidney disease, or unspecified chronic kidney disease: Secondary | ICD-10-CM | POA: Diagnosis not present

## 2017-03-04 DIAGNOSIS — I480 Paroxysmal atrial fibrillation: Secondary | ICD-10-CM | POA: Diagnosis not present

## 2017-03-04 DIAGNOSIS — I5033 Acute on chronic diastolic (congestive) heart failure: Secondary | ICD-10-CM | POA: Diagnosis not present

## 2017-03-09 DIAGNOSIS — M810 Age-related osteoporosis without current pathological fracture: Secondary | ICD-10-CM | POA: Diagnosis not present

## 2017-03-09 DIAGNOSIS — I130 Hypertensive heart and chronic kidney disease with heart failure and stage 1 through stage 4 chronic kidney disease, or unspecified chronic kidney disease: Secondary | ICD-10-CM | POA: Diagnosis not present

## 2017-03-09 DIAGNOSIS — I5033 Acute on chronic diastolic (congestive) heart failure: Secondary | ICD-10-CM | POA: Diagnosis not present

## 2017-03-09 DIAGNOSIS — M130 Polyarthritis, unspecified: Secondary | ICD-10-CM | POA: Diagnosis not present

## 2017-03-09 DIAGNOSIS — N189 Chronic kidney disease, unspecified: Secondary | ICD-10-CM | POA: Diagnosis not present

## 2017-03-09 DIAGNOSIS — I480 Paroxysmal atrial fibrillation: Secondary | ICD-10-CM | POA: Diagnosis not present

## 2017-03-11 DIAGNOSIS — N189 Chronic kidney disease, unspecified: Secondary | ICD-10-CM | POA: Diagnosis not present

## 2017-03-11 DIAGNOSIS — I5033 Acute on chronic diastolic (congestive) heart failure: Secondary | ICD-10-CM | POA: Diagnosis not present

## 2017-03-11 DIAGNOSIS — I480 Paroxysmal atrial fibrillation: Secondary | ICD-10-CM | POA: Diagnosis not present

## 2017-03-11 DIAGNOSIS — M810 Age-related osteoporosis without current pathological fracture: Secondary | ICD-10-CM | POA: Diagnosis not present

## 2017-03-11 DIAGNOSIS — I130 Hypertensive heart and chronic kidney disease with heart failure and stage 1 through stage 4 chronic kidney disease, or unspecified chronic kidney disease: Secondary | ICD-10-CM | POA: Diagnosis not present

## 2017-03-11 DIAGNOSIS — M130 Polyarthritis, unspecified: Secondary | ICD-10-CM | POA: Diagnosis not present

## 2017-03-15 DIAGNOSIS — M130 Polyarthritis, unspecified: Secondary | ICD-10-CM | POA: Diagnosis not present

## 2017-03-15 DIAGNOSIS — I5033 Acute on chronic diastolic (congestive) heart failure: Secondary | ICD-10-CM | POA: Diagnosis not present

## 2017-03-15 DIAGNOSIS — I480 Paroxysmal atrial fibrillation: Secondary | ICD-10-CM | POA: Diagnosis not present

## 2017-03-15 DIAGNOSIS — I130 Hypertensive heart and chronic kidney disease with heart failure and stage 1 through stage 4 chronic kidney disease, or unspecified chronic kidney disease: Secondary | ICD-10-CM | POA: Diagnosis not present

## 2017-03-15 DIAGNOSIS — N189 Chronic kidney disease, unspecified: Secondary | ICD-10-CM | POA: Diagnosis not present

## 2017-03-15 DIAGNOSIS — M810 Age-related osteoporosis without current pathological fracture: Secondary | ICD-10-CM | POA: Diagnosis not present

## 2017-03-17 DIAGNOSIS — I480 Paroxysmal atrial fibrillation: Secondary | ICD-10-CM | POA: Diagnosis not present

## 2017-03-17 DIAGNOSIS — I130 Hypertensive heart and chronic kidney disease with heart failure and stage 1 through stage 4 chronic kidney disease, or unspecified chronic kidney disease: Secondary | ICD-10-CM | POA: Diagnosis not present

## 2017-03-17 DIAGNOSIS — N189 Chronic kidney disease, unspecified: Secondary | ICD-10-CM | POA: Diagnosis not present

## 2017-03-17 DIAGNOSIS — M810 Age-related osteoporosis without current pathological fracture: Secondary | ICD-10-CM | POA: Diagnosis not present

## 2017-03-17 DIAGNOSIS — M130 Polyarthritis, unspecified: Secondary | ICD-10-CM | POA: Diagnosis not present

## 2017-03-17 DIAGNOSIS — I5033 Acute on chronic diastolic (congestive) heart failure: Secondary | ICD-10-CM | POA: Diagnosis not present

## 2017-03-23 DIAGNOSIS — N189 Chronic kidney disease, unspecified: Secondary | ICD-10-CM | POA: Diagnosis not present

## 2017-03-23 DIAGNOSIS — M130 Polyarthritis, unspecified: Secondary | ICD-10-CM | POA: Diagnosis not present

## 2017-03-23 DIAGNOSIS — M810 Age-related osteoporosis without current pathological fracture: Secondary | ICD-10-CM | POA: Diagnosis not present

## 2017-03-23 DIAGNOSIS — I5033 Acute on chronic diastolic (congestive) heart failure: Secondary | ICD-10-CM | POA: Diagnosis not present

## 2017-03-23 DIAGNOSIS — I480 Paroxysmal atrial fibrillation: Secondary | ICD-10-CM | POA: Diagnosis not present

## 2017-03-23 DIAGNOSIS — I130 Hypertensive heart and chronic kidney disease with heart failure and stage 1 through stage 4 chronic kidney disease, or unspecified chronic kidney disease: Secondary | ICD-10-CM | POA: Diagnosis not present

## 2017-03-25 DIAGNOSIS — I5033 Acute on chronic diastolic (congestive) heart failure: Secondary | ICD-10-CM | POA: Diagnosis not present

## 2017-03-25 DIAGNOSIS — N189 Chronic kidney disease, unspecified: Secondary | ICD-10-CM | POA: Diagnosis not present

## 2017-03-25 DIAGNOSIS — I480 Paroxysmal atrial fibrillation: Secondary | ICD-10-CM | POA: Diagnosis not present

## 2017-03-25 DIAGNOSIS — M810 Age-related osteoporosis without current pathological fracture: Secondary | ICD-10-CM | POA: Diagnosis not present

## 2017-03-25 DIAGNOSIS — M130 Polyarthritis, unspecified: Secondary | ICD-10-CM | POA: Diagnosis not present

## 2017-03-25 DIAGNOSIS — I130 Hypertensive heart and chronic kidney disease with heart failure and stage 1 through stage 4 chronic kidney disease, or unspecified chronic kidney disease: Secondary | ICD-10-CM | POA: Diagnosis not present

## 2017-03-29 DIAGNOSIS — M810 Age-related osteoporosis without current pathological fracture: Secondary | ICD-10-CM | POA: Diagnosis not present

## 2017-03-29 DIAGNOSIS — I5033 Acute on chronic diastolic (congestive) heart failure: Secondary | ICD-10-CM | POA: Diagnosis not present

## 2017-03-29 DIAGNOSIS — N189 Chronic kidney disease, unspecified: Secondary | ICD-10-CM | POA: Diagnosis not present

## 2017-03-29 DIAGNOSIS — I480 Paroxysmal atrial fibrillation: Secondary | ICD-10-CM | POA: Diagnosis not present

## 2017-03-29 DIAGNOSIS — M130 Polyarthritis, unspecified: Secondary | ICD-10-CM | POA: Diagnosis not present

## 2017-03-29 DIAGNOSIS — I130 Hypertensive heart and chronic kidney disease with heart failure and stage 1 through stage 4 chronic kidney disease, or unspecified chronic kidney disease: Secondary | ICD-10-CM | POA: Diagnosis not present

## 2017-04-01 DIAGNOSIS — M130 Polyarthritis, unspecified: Secondary | ICD-10-CM | POA: Diagnosis not present

## 2017-04-01 DIAGNOSIS — N189 Chronic kidney disease, unspecified: Secondary | ICD-10-CM | POA: Diagnosis not present

## 2017-04-01 DIAGNOSIS — I5033 Acute on chronic diastolic (congestive) heart failure: Secondary | ICD-10-CM | POA: Diagnosis not present

## 2017-04-01 DIAGNOSIS — I130 Hypertensive heart and chronic kidney disease with heart failure and stage 1 through stage 4 chronic kidney disease, or unspecified chronic kidney disease: Secondary | ICD-10-CM | POA: Diagnosis not present

## 2017-04-01 DIAGNOSIS — M810 Age-related osteoporosis without current pathological fracture: Secondary | ICD-10-CM | POA: Diagnosis not present

## 2017-04-01 DIAGNOSIS — I480 Paroxysmal atrial fibrillation: Secondary | ICD-10-CM | POA: Diagnosis not present

## 2017-04-02 DIAGNOSIS — I480 Paroxysmal atrial fibrillation: Secondary | ICD-10-CM | POA: Diagnosis not present

## 2017-04-02 DIAGNOSIS — I130 Hypertensive heart and chronic kidney disease with heart failure and stage 1 through stage 4 chronic kidney disease, or unspecified chronic kidney disease: Secondary | ICD-10-CM | POA: Diagnosis not present

## 2017-04-02 DIAGNOSIS — I5033 Acute on chronic diastolic (congestive) heart failure: Secondary | ICD-10-CM | POA: Diagnosis not present

## 2017-04-02 DIAGNOSIS — M130 Polyarthritis, unspecified: Secondary | ICD-10-CM | POA: Diagnosis not present

## 2017-04-02 DIAGNOSIS — N189 Chronic kidney disease, unspecified: Secondary | ICD-10-CM | POA: Diagnosis not present

## 2017-04-06 DIAGNOSIS — M810 Age-related osteoporosis without current pathological fracture: Secondary | ICD-10-CM | POA: Diagnosis not present

## 2017-04-06 DIAGNOSIS — I5033 Acute on chronic diastolic (congestive) heart failure: Secondary | ICD-10-CM | POA: Diagnosis not present

## 2017-04-06 DIAGNOSIS — I130 Hypertensive heart and chronic kidney disease with heart failure and stage 1 through stage 4 chronic kidney disease, or unspecified chronic kidney disease: Secondary | ICD-10-CM | POA: Diagnosis not present

## 2017-04-06 DIAGNOSIS — I480 Paroxysmal atrial fibrillation: Secondary | ICD-10-CM | POA: Diagnosis not present

## 2017-04-06 DIAGNOSIS — M130 Polyarthritis, unspecified: Secondary | ICD-10-CM | POA: Diagnosis not present

## 2017-04-06 DIAGNOSIS — N189 Chronic kidney disease, unspecified: Secondary | ICD-10-CM | POA: Diagnosis not present

## 2017-04-08 DIAGNOSIS — N189 Chronic kidney disease, unspecified: Secondary | ICD-10-CM | POA: Diagnosis not present

## 2017-04-08 DIAGNOSIS — I130 Hypertensive heart and chronic kidney disease with heart failure and stage 1 through stage 4 chronic kidney disease, or unspecified chronic kidney disease: Secondary | ICD-10-CM | POA: Diagnosis not present

## 2017-04-08 DIAGNOSIS — I480 Paroxysmal atrial fibrillation: Secondary | ICD-10-CM | POA: Diagnosis not present

## 2017-04-08 DIAGNOSIS — M810 Age-related osteoporosis without current pathological fracture: Secondary | ICD-10-CM | POA: Diagnosis not present

## 2017-04-08 DIAGNOSIS — I5033 Acute on chronic diastolic (congestive) heart failure: Secondary | ICD-10-CM | POA: Diagnosis not present

## 2017-04-08 DIAGNOSIS — M130 Polyarthritis, unspecified: Secondary | ICD-10-CM | POA: Diagnosis not present

## 2017-04-13 DIAGNOSIS — I130 Hypertensive heart and chronic kidney disease with heart failure and stage 1 through stage 4 chronic kidney disease, or unspecified chronic kidney disease: Secondary | ICD-10-CM | POA: Diagnosis not present

## 2017-04-13 DIAGNOSIS — M130 Polyarthritis, unspecified: Secondary | ICD-10-CM | POA: Diagnosis not present

## 2017-04-13 DIAGNOSIS — I5033 Acute on chronic diastolic (congestive) heart failure: Secondary | ICD-10-CM | POA: Diagnosis not present

## 2017-04-13 DIAGNOSIS — I480 Paroxysmal atrial fibrillation: Secondary | ICD-10-CM | POA: Diagnosis not present

## 2017-04-13 DIAGNOSIS — N189 Chronic kidney disease, unspecified: Secondary | ICD-10-CM | POA: Diagnosis not present

## 2017-04-13 DIAGNOSIS — M810 Age-related osteoporosis without current pathological fracture: Secondary | ICD-10-CM | POA: Diagnosis not present

## 2017-04-14 ENCOUNTER — Other Ambulatory Visit: Payer: Self-pay | Admitting: Sports Medicine

## 2017-04-14 DIAGNOSIS — M47816 Spondylosis without myelopathy or radiculopathy, lumbar region: Secondary | ICD-10-CM | POA: Diagnosis not present

## 2017-04-14 DIAGNOSIS — M810 Age-related osteoporosis without current pathological fracture: Secondary | ICD-10-CM | POA: Diagnosis not present

## 2017-04-14 DIAGNOSIS — M545 Low back pain: Secondary | ICD-10-CM

## 2017-04-16 DIAGNOSIS — I130 Hypertensive heart and chronic kidney disease with heart failure and stage 1 through stage 4 chronic kidney disease, or unspecified chronic kidney disease: Secondary | ICD-10-CM | POA: Diagnosis not present

## 2017-04-16 DIAGNOSIS — I480 Paroxysmal atrial fibrillation: Secondary | ICD-10-CM | POA: Diagnosis not present

## 2017-04-16 DIAGNOSIS — M130 Polyarthritis, unspecified: Secondary | ICD-10-CM | POA: Diagnosis not present

## 2017-04-16 DIAGNOSIS — I5033 Acute on chronic diastolic (congestive) heart failure: Secondary | ICD-10-CM | POA: Diagnosis not present

## 2017-04-16 DIAGNOSIS — N189 Chronic kidney disease, unspecified: Secondary | ICD-10-CM | POA: Diagnosis not present

## 2017-04-16 DIAGNOSIS — M810 Age-related osteoporosis without current pathological fracture: Secondary | ICD-10-CM | POA: Diagnosis not present

## 2017-04-22 DIAGNOSIS — M8000XA Age-related osteoporosis with current pathological fracture, unspecified site, initial encounter for fracture: Secondary | ICD-10-CM | POA: Insufficient documentation

## 2017-04-22 DIAGNOSIS — Z9181 History of falling: Secondary | ICD-10-CM | POA: Insufficient documentation

## 2017-04-22 DIAGNOSIS — R7303 Prediabetes: Secondary | ICD-10-CM | POA: Insufficient documentation

## 2017-04-22 DIAGNOSIS — M159 Polyosteoarthritis, unspecified: Secondary | ICD-10-CM | POA: Insufficient documentation

## 2017-04-22 DIAGNOSIS — N200 Calculus of kidney: Secondary | ICD-10-CM

## 2017-04-22 DIAGNOSIS — M8949 Other hypertrophic osteoarthropathy, multiple sites: Secondary | ICD-10-CM | POA: Insufficient documentation

## 2017-04-22 DIAGNOSIS — E039 Hypothyroidism, unspecified: Secondary | ICD-10-CM | POA: Insufficient documentation

## 2017-04-22 DIAGNOSIS — K5790 Diverticulosis of intestine, part unspecified, without perforation or abscess without bleeding: Secondary | ICD-10-CM | POA: Insufficient documentation

## 2017-04-22 DIAGNOSIS — C4490 Unspecified malignant neoplasm of skin, unspecified: Secondary | ICD-10-CM | POA: Diagnosis not present

## 2017-04-22 DIAGNOSIS — M150 Primary generalized (osteo)arthritis: Secondary | ICD-10-CM | POA: Diagnosis not present

## 2017-04-22 DIAGNOSIS — E559 Vitamin D deficiency, unspecified: Secondary | ICD-10-CM | POA: Diagnosis not present

## 2017-04-22 DIAGNOSIS — I481 Persistent atrial fibrillation: Secondary | ICD-10-CM | POA: Diagnosis not present

## 2017-04-22 DIAGNOSIS — M5136 Other intervertebral disc degeneration, lumbar region: Secondary | ICD-10-CM | POA: Insufficient documentation

## 2017-04-22 DIAGNOSIS — M8000XS Age-related osteoporosis with current pathological fracture, unspecified site, sequela: Secondary | ICD-10-CM | POA: Diagnosis not present

## 2017-04-22 DIAGNOSIS — Z79899 Other long term (current) drug therapy: Secondary | ICD-10-CM | POA: Diagnosis not present

## 2017-04-22 DIAGNOSIS — D649 Anemia, unspecified: Secondary | ICD-10-CM | POA: Diagnosis not present

## 2017-04-22 DIAGNOSIS — I5042 Chronic combined systolic (congestive) and diastolic (congestive) heart failure: Secondary | ICD-10-CM | POA: Diagnosis not present

## 2017-04-22 DIAGNOSIS — I25118 Atherosclerotic heart disease of native coronary artery with other forms of angina pectoris: Secondary | ICD-10-CM | POA: Diagnosis not present

## 2017-04-22 DIAGNOSIS — E663 Overweight: Secondary | ICD-10-CM | POA: Insufficient documentation

## 2017-04-22 DIAGNOSIS — N1831 Chronic kidney disease, stage 3a: Secondary | ICD-10-CM | POA: Insufficient documentation

## 2017-04-22 DIAGNOSIS — Z853 Personal history of malignant neoplasm of breast: Secondary | ICD-10-CM | POA: Insufficient documentation

## 2017-04-22 DIAGNOSIS — E782 Mixed hyperlipidemia: Secondary | ICD-10-CM | POA: Diagnosis not present

## 2017-04-22 HISTORY — DX: Calculus of kidney: N20.0

## 2017-04-26 ENCOUNTER — Ambulatory Visit
Admission: RE | Admit: 2017-04-26 | Discharge: 2017-04-26 | Disposition: A | Discharge status: Home / Self Care | Payer: Medicare Other | Source: Ambulatory Visit | Attending: Sports Medicine | Admitting: Sports Medicine

## 2017-04-26 DIAGNOSIS — M545 Low back pain: Secondary | ICD-10-CM

## 2017-04-26 DIAGNOSIS — M48061 Spinal stenosis, lumbar region without neurogenic claudication: Secondary | ICD-10-CM | POA: Diagnosis not present

## 2017-05-04 DIAGNOSIS — I130 Hypertensive heart and chronic kidney disease with heart failure and stage 1 through stage 4 chronic kidney disease, or unspecified chronic kidney disease: Secondary | ICD-10-CM | POA: Diagnosis not present

## 2017-05-04 DIAGNOSIS — I480 Paroxysmal atrial fibrillation: Secondary | ICD-10-CM | POA: Diagnosis not present

## 2017-05-04 DIAGNOSIS — N189 Chronic kidney disease, unspecified: Secondary | ICD-10-CM | POA: Diagnosis not present

## 2017-05-04 DIAGNOSIS — I5033 Acute on chronic diastolic (congestive) heart failure: Secondary | ICD-10-CM | POA: Diagnosis not present

## 2017-05-05 ENCOUNTER — Encounter: Payer: Self-pay | Admitting: Sports Medicine

## 2017-05-05 ENCOUNTER — Ambulatory Visit (INDEPENDENT_AMBULATORY_CARE_PROVIDER_SITE_OTHER): Payer: Medicare Other | Admitting: Sports Medicine

## 2017-05-05 DIAGNOSIS — M79671 Pain in right foot: Secondary | ICD-10-CM

## 2017-05-05 DIAGNOSIS — I739 Peripheral vascular disease, unspecified: Secondary | ICD-10-CM

## 2017-05-05 DIAGNOSIS — Z7901 Long term (current) use of anticoagulants: Secondary | ICD-10-CM

## 2017-05-05 DIAGNOSIS — M79672 Pain in left foot: Secondary | ICD-10-CM | POA: Diagnosis not present

## 2017-05-05 DIAGNOSIS — B351 Tinea unguium: Secondary | ICD-10-CM

## 2017-05-05 DIAGNOSIS — L84 Corns and callosities: Secondary | ICD-10-CM

## 2017-05-05 NOTE — Progress Notes (Signed)
Subjective: Maleyah Evans is a 84 y.o. female patient seen today in office with complaint of mildly painful thickened and elongated toenails and moderately painful corn on bottom of right foot>5th toe on right; unable to trim. Patient is assisted by caregiver.  Patient denies any other pedal complaints at this time.  There are no active problems to display for this patient.    Current Outpatient Medications:  .  acetaminophen (TYLENOL) 500 MG tablet, Take 500 mg by mouth at bedtime as needed., Disp: , Rfl:  .  acetaminophen (TYLENOL) 650 MG CR tablet, Take 650 mg by mouth every 8 (eight) hours as needed for pain., Disp: , Rfl:  .  amiodarone (PACERONE) 200 MG tablet, Take 200 mg by mouth daily., Disp: , Rfl:  .  amiodarone (PACERONE) 400 MG tablet, Take 400 mg by mouth., Disp: , Rfl:  .  B Complex-C (B-COMPLEX WITH VITAMIN C) tablet, Take 1 tablet by mouth daily. *cuts tablet in half to take the whole tablet total, Disp: , Rfl:  .  diltiazem (CARTIA XT) 240 MG 24 hr capsule, Take 240 mg by mouth daily. , Disp: , Rfl:  .  furosemide (LASIX) 20 MG tablet, Take 20 mg by mouth., Disp: , Rfl:  .  HYDROcodone-acetaminophen (NORCO/VICODIN) 5-325 MG tablet, Take 0.5-1 tablets by mouth at bedtime as needed for moderate pain., Disp: , Rfl:  .  levothyroxine (SYNTHROID, LEVOTHROID) 50 MCG tablet, Take 50 mcg by mouth daily before breakfast. , Disp: , Rfl:  .  mupirocin ointment (BACTROBAN) 2 %, Place 1 application into the nose daily as needed (for skin wounds)., Disp: , Rfl:  .  potassium chloride (K-DUR,KLOR-CON) 10 MEQ tablet, Take 10 mEq by mouth 2 (two) times daily., Disp: , Rfl:  .  Propylene Glycol (SYSTANE BALANCE) 0.6 % SOLN, Place 1 drop into both eyes daily as needed (for dry eyes)., Disp: , Rfl:  .  rivaroxaban (XARELTO) 20 MG TABS tablet, Take 20 mg by mouth daily with supper. , Disp: , Rfl:  .  vitamin B-12 (CYANOCOBALAMIN) 1000 MCG tablet, Take 1,000 mcg by mouth daily. *Cuts tablet in half to  take the whole tablet total, Disp: , Rfl:  .  Vitamin D, Ergocalciferol, (DRISDOL) 50000 units CAPS capsule, Take 50,000 Units by mouth every 7 (seven) days. friday, Disp: , Rfl:   Allergies  Allergen Reactions  . Aspirin     Other reaction(s): OTHER  . Ciprofloxacin     Other reaction(s): OTHER  . Food     NUTS AND SEEDS == DUE TO diverticulitis  DOES NOT EAT CHOCOLATE OR TAKE CAFFEINE PRODUCTS  . Iodinated Diagnostic Agents     Tremors and shaking  . Penicillins   . Phenobarbital     Other reaction(s): UNKNOWN  . Statins     Other reaction(s): OTHER  . Sulfa Antibiotics     Other reaction(s): RASH    Objective: Physical Exam  General: Well developed, nourished, no acute distress, awake, alert and oriented x 3  Vascular: Dorsalis pedis artery 1/4 bilateral, Posterior tibial artery 0/4 bilateral due to trace edema at ankles, skin temperature warm to warm proximal to distal bilateral lower extremities, + varicosities, scant pedal hair present bilateral.  Neurological: Gross sensation present via light touch bilateral.   Dermatological: Skin is warm, dry, and supple bilateral, Nails 1-10 are tender, long, thick, and discolored with mild subungal debris, no webspace macerations present bilateral, no open lesions present bilateral, + hyperkeratotic tissue present right 5th toe  and sub met 1 on right. No signs of infection bilateral.  Musculoskeletal: Mild pain to callus areas on right foot. Asymptomatic hammertoe boney deformities noted bilateral. Muscular strength within normal limits without pain on range of motion. No pain with calf compression bilateral.  Assessment and Plan:  Problem List Items Addressed This Visit    None    Visit Diagnoses    Dermatophytosis of nail    -  Primary   Corns and callosities       Foot pain, bilateral       PVD (peripheral vascular disease) ([place])       Current use of long term anticoagulation          -Examined patient.  -Discussed  treatment options for painful mycotic nails and callus. -Mechanically debrided callus x 2 using sterile chisel blade and reduced mycotic nails with sterile nail nipper and dremel nail file without incident. -Recommend continue with good supportive shoes  -Patient to return in 3 months for follow up evaluation or sooner if symptoms worsen.  Landis Martins, DPM

## 2017-05-06 ENCOUNTER — Ambulatory Visit: Payer: Medicare Other | Admitting: Sports Medicine

## 2017-05-30 DIAGNOSIS — J069 Acute upper respiratory infection, unspecified: Secondary | ICD-10-CM | POA: Diagnosis not present

## 2017-06-01 DIAGNOSIS — M47816 Spondylosis without myelopathy or radiculopathy, lumbar region: Secondary | ICD-10-CM | POA: Diagnosis not present

## 2017-06-01 DIAGNOSIS — M810 Age-related osteoporosis without current pathological fracture: Secondary | ICD-10-CM | POA: Diagnosis not present

## 2017-06-07 DIAGNOSIS — I5042 Chronic combined systolic (congestive) and diastolic (congestive) heart failure: Secondary | ICD-10-CM | POA: Diagnosis not present

## 2017-06-07 DIAGNOSIS — M1991 Primary osteoarthritis, unspecified site: Secondary | ICD-10-CM | POA: Insufficient documentation

## 2017-06-07 DIAGNOSIS — R05 Cough: Secondary | ICD-10-CM | POA: Diagnosis not present

## 2017-06-07 DIAGNOSIS — M23205 Derangement of unspecified medial meniscus due to old tear or injury, unspecified knee: Secondary | ICD-10-CM | POA: Insufficient documentation

## 2017-06-07 DIAGNOSIS — M1710 Unilateral primary osteoarthritis, unspecified knee: Secondary | ICD-10-CM | POA: Insufficient documentation

## 2017-06-07 DIAGNOSIS — I110 Hypertensive heart disease with heart failure: Secondary | ICD-10-CM | POA: Diagnosis not present

## 2017-06-07 DIAGNOSIS — E785 Hyperlipidemia, unspecified: Secondary | ICD-10-CM | POA: Insufficient documentation

## 2017-06-07 DIAGNOSIS — J40 Bronchitis, not specified as acute or chronic: Secondary | ICD-10-CM | POA: Diagnosis not present

## 2017-06-07 DIAGNOSIS — M179 Osteoarthritis of knee, unspecified: Secondary | ICD-10-CM | POA: Insufficient documentation

## 2017-06-07 DIAGNOSIS — I481 Persistent atrial fibrillation: Secondary | ICD-10-CM | POA: Diagnosis not present

## 2017-06-07 DIAGNOSIS — R7303 Prediabetes: Secondary | ICD-10-CM | POA: Diagnosis not present

## 2017-06-07 DIAGNOSIS — Z79899 Other long term (current) drug therapy: Secondary | ICD-10-CM | POA: Diagnosis not present

## 2017-06-08 DIAGNOSIS — D0921 Carcinoma in situ of right eye: Secondary | ICD-10-CM | POA: Diagnosis not present

## 2017-06-16 DIAGNOSIS — M5136 Other intervertebral disc degeneration, lumbar region: Secondary | ICD-10-CM | POA: Diagnosis not present

## 2017-06-16 DIAGNOSIS — M47816 Spondylosis without myelopathy or radiculopathy, lumbar region: Secondary | ICD-10-CM | POA: Diagnosis not present

## 2017-06-18 DIAGNOSIS — Q6102 Congenital multiple renal cysts: Secondary | ICD-10-CM | POA: Diagnosis not present

## 2017-06-18 DIAGNOSIS — R3121 Asymptomatic microscopic hematuria: Secondary | ICD-10-CM | POA: Diagnosis not present

## 2017-06-18 DIAGNOSIS — N2889 Other specified disorders of kidney and ureter: Secondary | ICD-10-CM | POA: Diagnosis not present

## 2017-06-18 DIAGNOSIS — R311 Benign essential microscopic hematuria: Secondary | ICD-10-CM | POA: Diagnosis not present

## 2017-06-22 DIAGNOSIS — M5136 Other intervertebral disc degeneration, lumbar region: Secondary | ICD-10-CM | POA: Diagnosis not present

## 2017-06-22 DIAGNOSIS — M47816 Spondylosis without myelopathy or radiculopathy, lumbar region: Secondary | ICD-10-CM | POA: Diagnosis not present

## 2017-06-24 DIAGNOSIS — M5136 Other intervertebral disc degeneration, lumbar region: Secondary | ICD-10-CM | POA: Diagnosis not present

## 2017-06-24 DIAGNOSIS — M47816 Spondylosis without myelopathy or radiculopathy, lumbar region: Secondary | ICD-10-CM | POA: Diagnosis not present

## 2017-06-29 DIAGNOSIS — M810 Age-related osteoporosis without current pathological fracture: Secondary | ICD-10-CM | POA: Diagnosis not present

## 2017-06-29 DIAGNOSIS — R5383 Other fatigue: Secondary | ICD-10-CM | POA: Diagnosis not present

## 2017-06-29 DIAGNOSIS — M47816 Spondylosis without myelopathy or radiculopathy, lumbar region: Secondary | ICD-10-CM | POA: Diagnosis not present

## 2017-06-29 DIAGNOSIS — M5136 Other intervertebral disc degeneration, lumbar region: Secondary | ICD-10-CM | POA: Diagnosis not present

## 2017-06-29 DIAGNOSIS — E559 Vitamin D deficiency, unspecified: Secondary | ICD-10-CM | POA: Diagnosis not present

## 2017-07-02 DIAGNOSIS — M5136 Other intervertebral disc degeneration, lumbar region: Secondary | ICD-10-CM | POA: Diagnosis not present

## 2017-07-02 DIAGNOSIS — M47816 Spondylosis without myelopathy or radiculopathy, lumbar region: Secondary | ICD-10-CM | POA: Diagnosis not present

## 2017-07-14 DIAGNOSIS — I4892 Unspecified atrial flutter: Secondary | ICD-10-CM | POA: Diagnosis not present

## 2017-07-14 DIAGNOSIS — I10 Essential (primary) hypertension: Secondary | ICD-10-CM | POA: Diagnosis not present

## 2017-07-14 DIAGNOSIS — I480 Paroxysmal atrial fibrillation: Secondary | ICD-10-CM | POA: Diagnosis not present

## 2017-07-20 DIAGNOSIS — M810 Age-related osteoporosis without current pathological fracture: Secondary | ICD-10-CM | POA: Diagnosis not present

## 2017-07-20 DIAGNOSIS — M47816 Spondylosis without myelopathy or radiculopathy, lumbar region: Secondary | ICD-10-CM | POA: Diagnosis not present

## 2017-07-20 DIAGNOSIS — M5136 Other intervertebral disc degeneration, lumbar region: Secondary | ICD-10-CM | POA: Diagnosis not present

## 2017-07-22 DIAGNOSIS — M5136 Other intervertebral disc degeneration, lumbar region: Secondary | ICD-10-CM | POA: Diagnosis not present

## 2017-07-22 DIAGNOSIS — M47816 Spondylosis without myelopathy or radiculopathy, lumbar region: Secondary | ICD-10-CM | POA: Diagnosis not present

## 2017-07-26 DIAGNOSIS — M47816 Spondylosis without myelopathy or radiculopathy, lumbar region: Secondary | ICD-10-CM | POA: Diagnosis not present

## 2017-07-26 DIAGNOSIS — M5136 Other intervertebral disc degeneration, lumbar region: Secondary | ICD-10-CM | POA: Diagnosis not present

## 2017-07-28 DIAGNOSIS — M5136 Other intervertebral disc degeneration, lumbar region: Secondary | ICD-10-CM | POA: Diagnosis not present

## 2017-07-28 DIAGNOSIS — M47816 Spondylosis without myelopathy or radiculopathy, lumbar region: Secondary | ICD-10-CM | POA: Diagnosis not present

## 2017-08-01 DIAGNOSIS — R05 Cough: Secondary | ICD-10-CM | POA: Diagnosis not present

## 2017-08-01 DIAGNOSIS — R079 Chest pain, unspecified: Secondary | ICD-10-CM | POA: Diagnosis not present

## 2017-08-01 DIAGNOSIS — J181 Lobar pneumonia, unspecified organism: Secondary | ICD-10-CM | POA: Diagnosis not present

## 2017-08-01 DIAGNOSIS — J189 Pneumonia, unspecified organism: Secondary | ICD-10-CM | POA: Diagnosis not present

## 2017-08-01 DIAGNOSIS — I4891 Unspecified atrial fibrillation: Secondary | ICD-10-CM | POA: Diagnosis not present

## 2017-08-01 DIAGNOSIS — I509 Heart failure, unspecified: Secondary | ICD-10-CM | POA: Diagnosis not present

## 2017-08-01 DIAGNOSIS — N390 Urinary tract infection, site not specified: Secondary | ICD-10-CM | POA: Diagnosis not present

## 2017-08-01 DIAGNOSIS — R319 Hematuria, unspecified: Secondary | ICD-10-CM | POA: Diagnosis not present

## 2017-08-04 DIAGNOSIS — J181 Lobar pneumonia, unspecified organism: Secondary | ICD-10-CM | POA: Diagnosis not present

## 2017-08-04 DIAGNOSIS — I5042 Chronic combined systolic (congestive) and diastolic (congestive) heart failure: Secondary | ICD-10-CM | POA: Diagnosis not present

## 2017-08-04 DIAGNOSIS — I481 Persistent atrial fibrillation: Secondary | ICD-10-CM | POA: Diagnosis not present

## 2017-08-05 ENCOUNTER — Ambulatory Visit: Payer: Medicare Other | Admitting: Sports Medicine

## 2017-08-06 DIAGNOSIS — S40021S Contusion of right upper arm, sequela: Secondary | ICD-10-CM | POA: Diagnosis not present

## 2017-08-06 DIAGNOSIS — J181 Lobar pneumonia, unspecified organism: Secondary | ICD-10-CM | POA: Diagnosis not present

## 2017-08-06 DIAGNOSIS — I481 Persistent atrial fibrillation: Secondary | ICD-10-CM | POA: Diagnosis not present

## 2017-08-06 DIAGNOSIS — I5042 Chronic combined systolic (congestive) and diastolic (congestive) heart failure: Secondary | ICD-10-CM | POA: Diagnosis not present

## 2017-08-12 ENCOUNTER — Encounter: Payer: Self-pay | Admitting: Sports Medicine

## 2017-08-12 ENCOUNTER — Ambulatory Visit (INDEPENDENT_AMBULATORY_CARE_PROVIDER_SITE_OTHER): Payer: Medicare Other | Admitting: Sports Medicine

## 2017-08-12 DIAGNOSIS — M79671 Pain in right foot: Secondary | ICD-10-CM

## 2017-08-12 DIAGNOSIS — I739 Peripheral vascular disease, unspecified: Secondary | ICD-10-CM | POA: Diagnosis not present

## 2017-08-12 DIAGNOSIS — Z7901 Long term (current) use of anticoagulants: Secondary | ICD-10-CM | POA: Diagnosis not present

## 2017-08-12 DIAGNOSIS — L84 Corns and callosities: Secondary | ICD-10-CM

## 2017-08-12 DIAGNOSIS — M79672 Pain in left foot: Secondary | ICD-10-CM

## 2017-08-12 DIAGNOSIS — B351 Tinea unguium: Secondary | ICD-10-CM

## 2017-08-12 NOTE — Progress Notes (Signed)
Subjective: Arnetra Terris is a 84 y.o. female patient seen today in office with complaint of mildly painful thickened and elongated toenails and  mildly painful corn on bottom of right foot>5th toe on right; unable to trim. Patient is assisted by caregiver.  Patient denies any other pedal complaints at this time.  There are no active problems to display for this patient.    Current Outpatient Medications:  .  acetaminophen (TYLENOL) 500 MG tablet, Take 500 mg by mouth at bedtime as needed., Disp: , Rfl:  .  acetaminophen (TYLENOL) 650 MG CR tablet, Take 650 mg by mouth every 8 (eight) hours as needed for pain., Disp: , Rfl:  .  amiodarone (PACERONE) 200 MG tablet, Take 200 mg by mouth daily., Disp: , Rfl:  .  B Complex-C (B-COMPLEX WITH VITAMIN C) tablet, Take 1 tablet by mouth daily. *cuts tablet in half to take the whole tablet total, Disp: , Rfl:  .  diltiazem (CARTIA XT) 240 MG 24 hr capsule, Take 240 mg by mouth daily. , Disp: , Rfl:  .  furosemide (LASIX) 20 MG tablet, Take 20 mg by mouth., Disp: , Rfl:  .  HYDROcodone-acetaminophen (NORCO/VICODIN) 5-325 MG tablet, Take 0.5-1 tablets by mouth at bedtime as needed for moderate pain., Disp: , Rfl:  .  levothyroxine (SYNTHROID, LEVOTHROID) 50 MCG tablet, Take 50 mcg by mouth daily before breakfast. , Disp: , Rfl:  .  mupirocin ointment (BACTROBAN) 2 %, Place 1 application into the nose daily as needed (for skin wounds)., Disp: , Rfl:  .  potassium chloride (K-DUR,KLOR-CON) 10 MEQ tablet, Take 10 mEq by mouth 2 (two) times daily., Disp: , Rfl:  .  Propylene Glycol (SYSTANE BALANCE) 0.6 % SOLN, Place 1 drop into both eyes daily as needed (for dry eyes)., Disp: , Rfl:  .  rivaroxaban (XARELTO) 20 MG TABS tablet, Take 20 mg by mouth daily with supper. , Disp: , Rfl:  .  vitamin B-12 (CYANOCOBALAMIN) 1000 MCG tablet, Take 1,000 mcg by mouth daily. *Cuts tablet in half to take the whole tablet total, Disp: , Rfl:  .  Vitamin D, Ergocalciferol,  (DRISDOL) 50000 units CAPS capsule, Take 50,000 Units by mouth every 7 (seven) days. friday, Disp: , Rfl:  .  amiodarone (PACERONE) 400 MG tablet, Take 400 mg by mouth., Disp: , Rfl:   Allergies  Allergen Reactions  . Aspirin     Other reaction(s): OTHER  . Ciprofloxacin     Other reaction(s): OTHER  . Food     NUTS AND SEEDS == DUE TO diverticulitis  DOES NOT EAT CHOCOLATE OR TAKE CAFFEINE PRODUCTS  . Iodinated Diagnostic Agents     Tremors and shaking  . Penicillins   . Phenobarbital     Other reaction(s): UNKNOWN  . Statins     Other reaction(s): OTHER  . Sulfa Antibiotics     Other reaction(s): RASH    Objective: Physical Exam  General: Well developed, nourished, no acute distress, awake, alert and oriented x 3  Vascular: Dorsalis pedis artery 1/4 bilateral, Posterior tibial artery 0/4 bilateral due to trace edema at ankles, skin temperature warm to warm proximal to distal bilateral lower extremities, + varicosities, scant pedal hair present bilateral.  Neurological: Gross sensation present via light touch bilateral.   Dermatological: Skin is warm, dry, and supple bilateral, Nails 1-10 are tender, long, thick, and discolored with mild subungal debris, no webspace macerations present bilateral, no open lesions present bilateral, + hyperkeratotic tissue present right 5th  toe and sub met 1 on right. No signs of infection bilateral.  Musculoskeletal: Mild pain to callus areas on right foot. Asymptomatic hammertoe boney deformities noted bilateral. Muscular strength within normal limits without pain on range of motion. No pain with calf compression bilateral.  Assessment and Plan:  Problem List Items Addressed This Visit    None    Visit Diagnoses    Dermatophytosis of nail    -  Primary   Corns and callosities       Foot pain, bilateral       PVD (peripheral vascular disease) ([place])       Current use of long term anticoagulation          -Examined patient.   -Discussed treatment options for painful mycotic nails and callus. -Mechanically debrided callus x 2 using sterile chisel blade and reduced mycotic nails with sterile nail nipper and dremel nail file without incident. -Recommend continue with good supportive shoes and moisturing creams for callus skin -Patient to return in 3 months for follow up evaluation or sooner if symptoms worsen.  Landis Martins, DPM

## 2017-08-25 DIAGNOSIS — I10 Essential (primary) hypertension: Secondary | ICD-10-CM | POA: Diagnosis not present

## 2017-08-25 DIAGNOSIS — I480 Paroxysmal atrial fibrillation: Secondary | ICD-10-CM | POA: Diagnosis not present

## 2017-08-25 DIAGNOSIS — L820 Inflamed seborrheic keratosis: Secondary | ICD-10-CM | POA: Diagnosis not present

## 2017-08-25 DIAGNOSIS — E782 Mixed hyperlipidemia: Secondary | ICD-10-CM | POA: Diagnosis not present

## 2017-08-25 DIAGNOSIS — L570 Actinic keratosis: Secondary | ICD-10-CM | POA: Diagnosis not present

## 2017-08-25 DIAGNOSIS — L821 Other seborrheic keratosis: Secondary | ICD-10-CM | POA: Diagnosis not present

## 2017-08-25 DIAGNOSIS — L578 Other skin changes due to chronic exposure to nonionizing radiation: Secondary | ICD-10-CM | POA: Diagnosis not present

## 2017-08-25 DIAGNOSIS — I4892 Unspecified atrial flutter: Secondary | ICD-10-CM | POA: Diagnosis not present

## 2017-08-26 DIAGNOSIS — I4891 Unspecified atrial fibrillation: Secondary | ICD-10-CM | POA: Diagnosis not present

## 2017-08-26 DIAGNOSIS — I4892 Unspecified atrial flutter: Secondary | ICD-10-CM | POA: Diagnosis not present

## 2017-08-26 DIAGNOSIS — I480 Paroxysmal atrial fibrillation: Secondary | ICD-10-CM | POA: Diagnosis not present

## 2017-08-27 DIAGNOSIS — N3001 Acute cystitis with hematuria: Secondary | ICD-10-CM | POA: Diagnosis not present

## 2017-08-27 DIAGNOSIS — N3091 Cystitis, unspecified with hematuria: Secondary | ICD-10-CM | POA: Diagnosis not present

## 2017-09-09 DIAGNOSIS — J181 Lobar pneumonia, unspecified organism: Secondary | ICD-10-CM | POA: Diagnosis not present

## 2017-09-30 DIAGNOSIS — R918 Other nonspecific abnormal finding of lung field: Secondary | ICD-10-CM | POA: Diagnosis not present

## 2017-10-02 DIAGNOSIS — E041 Nontoxic single thyroid nodule: Secondary | ICD-10-CM | POA: Insufficient documentation

## 2017-11-11 ENCOUNTER — Encounter: Payer: Self-pay | Admitting: Sports Medicine

## 2017-11-11 ENCOUNTER — Ambulatory Visit (INDEPENDENT_AMBULATORY_CARE_PROVIDER_SITE_OTHER): Payer: Medicare Other | Admitting: Sports Medicine

## 2017-11-11 VITALS — BP 147/79 | HR 76 | Resp 16

## 2017-11-11 DIAGNOSIS — M79672 Pain in left foot: Secondary | ICD-10-CM | POA: Diagnosis not present

## 2017-11-11 DIAGNOSIS — B351 Tinea unguium: Secondary | ICD-10-CM

## 2017-11-11 DIAGNOSIS — I739 Peripheral vascular disease, unspecified: Secondary | ICD-10-CM | POA: Diagnosis not present

## 2017-11-11 DIAGNOSIS — Z7901 Long term (current) use of anticoagulants: Secondary | ICD-10-CM

## 2017-11-11 DIAGNOSIS — M79671 Pain in right foot: Secondary | ICD-10-CM

## 2017-11-11 DIAGNOSIS — L84 Corns and callosities: Secondary | ICD-10-CM

## 2017-11-11 NOTE — Progress Notes (Signed)
Subjective: Deborah Ball is a 84 y.o. female patient seen today in office with complaint of mildly painful thickened and elongated toenails and  mildly painful corn on bottom of right foot>5th toe on right; unable to trim. Patient is assisted by caregiver.  Patient denies any other pedal complaints at this time.  Patient denies any changes with medication or health since last visit.  There are no active problems to display for this patient.    Current Outpatient Medications:  .  acetaminophen (TYLENOL) 500 MG tablet, Take 500 mg by mouth at bedtime as needed., Disp: , Rfl:  .  acetaminophen (TYLENOL) 650 MG CR tablet, Take 650 mg by mouth every 8 (eight) hours as needed for pain., Disp: , Rfl:  .  amiodarone (PACERONE) 200 MG tablet, Take 200 mg by mouth daily., Disp: , Rfl:  .  amiodarone (PACERONE) 400 MG tablet, Take 400 mg by mouth., Disp: , Rfl:  .  B Complex-C (B-COMPLEX WITH VITAMIN C) tablet, Take 1 tablet by mouth daily. *cuts tablet in half to take the whole tablet total, Disp: , Rfl:  .  diltiazem (CARTIA XT) 240 MG 24 hr capsule, Take 240 mg by mouth daily. , Disp: , Rfl:  .  furosemide (LASIX) 20 MG tablet, Take 20 mg by mouth., Disp: , Rfl:  .  HYDROcodone-acetaminophen (NORCO/VICODIN) 5-325 MG tablet, Take 0.5-1 tablets by mouth at bedtime as needed for moderate pain., Disp: , Rfl:  .  levothyroxine (SYNTHROID, LEVOTHROID) 50 MCG tablet, Take 50 mcg by mouth daily before breakfast. , Disp: , Rfl:  .  mupirocin ointment (BACTROBAN) 2 %, Place 1 application into the nose daily as needed (for skin wounds)., Disp: , Rfl:  .  potassium chloride (K-DUR,KLOR-CON) 10 MEQ tablet, Take 10 mEq by mouth 2 (two) times daily., Disp: , Rfl:  .  Propylene Glycol (SYSTANE BALANCE) 0.6 % SOLN, Place 1 drop into both eyes daily as needed (for dry eyes)., Disp: , Rfl:  .  rivaroxaban (XARELTO) 20 MG TABS tablet, Take 20 mg by mouth daily with supper. , Disp: , Rfl:  .  vitamin B-12 (CYANOCOBALAMIN)  1000 MCG tablet, Take 1,000 mcg by mouth daily. *Cuts tablet in half to take the whole tablet total, Disp: , Rfl:  .  Vitamin D, Ergocalciferol, (DRISDOL) 50000 units CAPS capsule, Take 50,000 Units by mouth every 7 (seven) days. friday, Disp: , Rfl:   Allergies  Allergen Reactions  . Other Anaphylaxis and Other (See Comments)    Phenbarbitol-unknown reaction, but pt had been told allergic Contrast Dye   . Statins Other (See Comments)    Other reaction(s): OTHER Joint pain  Other reaction(s): OTHER   . Sulfur Rash  . Clarithromycin Other (See Comments)    Joint pain   . Iodinated Diagnostic Agents Other (See Comments)    Tremors and shaking Other reaction(s): OTHER Shaking   . Morphine Other (See Comments)    Other reaction(s): Other (See Comments) AMS, Agitation   . Aspirin     Other reaction(s): OTHER  . Ciprofloxacin     Other reaction(s): OTHER  . Food     NUTS AND SEEDS == DUE TO diverticulitis  DOES NOT EAT CHOCOLATE OR TAKE CAFFEINE PRODUCTS  . Penicillins   . Phenobarbital     Other reaction(s): UNKNOWN  . Sulfa Antibiotics     Other reaction(s): RASH Other reaction(s): RASH    Objective: Physical Exam  General: Well developed, nourished, no acute distress, awake, alert and  oriented x 3  Vascular: Dorsalis pedis artery 1/4 bilateral, Posterior tibial artery 0/4 bilateral due to trace edema at ankles, skin temperature warm to warm proximal to distal bilateral lower extremities, + varicosities, scant pedal hair present bilateral.  Neurological: Gross sensation present via light touch bilateral.   Dermatological: Skin is warm, dry, and supple bilateral, Nails 1-10 are tender, long, thick, and discolored with mild subungal debris, no webspace macerations present bilateral, no open lesions present bilateral, + hyperkeratotic tissue present right 5th toe and sub met 1 on right minimal at today's visit. No signs of infection bilateral.  Musculoskeletal: Mild  pain to callus areas on right foot. Asymptomatic hammertoe boney deformities noted bilateral. Muscular strength within normal limits without pain on range of motion. No pain with calf compression bilateral.  Assessment and Plan:  Problem List Items Addressed This Visit    None    Visit Diagnoses    Dermatophytosis of nail    -  Primary   Corns and callosities       Foot pain, bilateral       PVD (peripheral vascular disease) ([place])       Current use of long term anticoagulation          -Examined patient.  -Discussed treatment options for painful mycotic nails and callus. -Mechanically debrided callus x 2 using sterile chisel blade at no additional charge and reduced mycotic nails with sterile nail nipper and dremel nail file without incident. -Recommend continue with good supportive shoes and moisturing creams for callus skin as previous -Patient to return in 3 months for follow up evaluation or sooner if symptoms worsen.  Landis Martins, DPM

## 2017-11-19 DIAGNOSIS — I4819 Other persistent atrial fibrillation: Secondary | ICD-10-CM | POA: Diagnosis not present

## 2017-11-19 DIAGNOSIS — Z23 Encounter for immunization: Secondary | ICD-10-CM | POA: Diagnosis not present

## 2017-11-19 DIAGNOSIS — E041 Nontoxic single thyroid nodule: Secondary | ICD-10-CM | POA: Diagnosis not present

## 2017-11-19 DIAGNOSIS — D500 Iron deficiency anemia secondary to blood loss (chronic): Secondary | ICD-10-CM | POA: Diagnosis not present

## 2017-11-19 DIAGNOSIS — I25118 Atherosclerotic heart disease of native coronary artery with other forms of angina pectoris: Secondary | ICD-10-CM | POA: Diagnosis not present

## 2017-11-19 DIAGNOSIS — E039 Hypothyroidism, unspecified: Secondary | ICD-10-CM | POA: Diagnosis not present

## 2017-11-19 DIAGNOSIS — I130 Hypertensive heart and chronic kidney disease with heart failure and stage 1 through stage 4 chronic kidney disease, or unspecified chronic kidney disease: Secondary | ICD-10-CM | POA: Diagnosis not present

## 2017-11-19 DIAGNOSIS — I5042 Chronic combined systolic (congestive) and diastolic (congestive) heart failure: Secondary | ICD-10-CM | POA: Diagnosis not present

## 2017-11-19 DIAGNOSIS — K7689 Other specified diseases of liver: Secondary | ICD-10-CM | POA: Diagnosis not present

## 2017-11-19 DIAGNOSIS — N183 Chronic kidney disease, stage 3 (moderate): Secondary | ICD-10-CM | POA: Diagnosis not present

## 2017-11-20 DIAGNOSIS — K7460 Unspecified cirrhosis of liver: Secondary | ICD-10-CM | POA: Insufficient documentation

## 2017-12-14 DIAGNOSIS — Z961 Presence of intraocular lens: Secondary | ICD-10-CM | POA: Diagnosis not present

## 2017-12-14 DIAGNOSIS — H01003 Unspecified blepharitis right eye, unspecified eyelid: Secondary | ICD-10-CM | POA: Diagnosis not present

## 2017-12-14 DIAGNOSIS — H01006 Unspecified blepharitis left eye, unspecified eyelid: Secondary | ICD-10-CM | POA: Diagnosis not present

## 2017-12-14 DIAGNOSIS — D0921 Carcinoma in situ of right eye: Secondary | ICD-10-CM | POA: Diagnosis not present

## 2018-01-20 DIAGNOSIS — Z0000 Encounter for general adult medical examination without abnormal findings: Secondary | ICD-10-CM | POA: Diagnosis not present

## 2018-01-20 DIAGNOSIS — I5042 Chronic combined systolic (congestive) and diastolic (congestive) heart failure: Secondary | ICD-10-CM | POA: Diagnosis not present

## 2018-01-20 DIAGNOSIS — R490 Dysphonia: Secondary | ICD-10-CM | POA: Diagnosis not present

## 2018-01-20 DIAGNOSIS — I4819 Other persistent atrial fibrillation: Secondary | ICD-10-CM | POA: Diagnosis not present

## 2018-01-20 DIAGNOSIS — E039 Hypothyroidism, unspecified: Secondary | ICD-10-CM | POA: Diagnosis not present

## 2018-01-20 DIAGNOSIS — K7469 Other cirrhosis of liver: Secondary | ICD-10-CM | POA: Diagnosis not present

## 2018-01-20 DIAGNOSIS — G250 Essential tremor: Secondary | ICD-10-CM | POA: Insufficient documentation

## 2018-01-20 DIAGNOSIS — I10 Essential (primary) hypertension: Secondary | ICD-10-CM | POA: Diagnosis not present

## 2018-01-20 DIAGNOSIS — I110 Hypertensive heart disease with heart failure: Secondary | ICD-10-CM | POA: Diagnosis not present

## 2018-01-20 DIAGNOSIS — R7303 Prediabetes: Secondary | ICD-10-CM | POA: Diagnosis not present

## 2018-01-20 DIAGNOSIS — R748 Abnormal levels of other serum enzymes: Secondary | ICD-10-CM | POA: Diagnosis not present

## 2018-01-24 DIAGNOSIS — I10 Essential (primary) hypertension: Secondary | ICD-10-CM | POA: Diagnosis not present

## 2018-01-24 DIAGNOSIS — E782 Mixed hyperlipidemia: Secondary | ICD-10-CM | POA: Diagnosis not present

## 2018-01-24 DIAGNOSIS — I4892 Unspecified atrial flutter: Secondary | ICD-10-CM | POA: Diagnosis not present

## 2018-01-24 DIAGNOSIS — I480 Paroxysmal atrial fibrillation: Secondary | ICD-10-CM | POA: Diagnosis not present

## 2018-02-03 ENCOUNTER — Ambulatory Visit: Payer: Medicare Other | Admitting: Sports Medicine

## 2018-02-10 DIAGNOSIS — J342 Deviated nasal septum: Secondary | ICD-10-CM | POA: Diagnosis not present

## 2018-02-10 DIAGNOSIS — H6121 Impacted cerumen, right ear: Secondary | ICD-10-CM | POA: Diagnosis not present

## 2018-02-10 DIAGNOSIS — R1310 Dysphagia, unspecified: Secondary | ICD-10-CM | POA: Diagnosis not present

## 2018-02-10 DIAGNOSIS — R05 Cough: Secondary | ICD-10-CM | POA: Diagnosis not present

## 2018-02-10 DIAGNOSIS — R0989 Other specified symptoms and signs involving the circulatory and respiratory systems: Secondary | ICD-10-CM | POA: Diagnosis not present

## 2018-02-10 DIAGNOSIS — E039 Hypothyroidism, unspecified: Secondary | ICD-10-CM | POA: Diagnosis not present

## 2018-02-10 DIAGNOSIS — R490 Dysphonia: Secondary | ICD-10-CM | POA: Diagnosis not present

## 2018-02-11 DIAGNOSIS — R5383 Other fatigue: Secondary | ICD-10-CM | POA: Diagnosis not present

## 2018-02-11 DIAGNOSIS — E559 Vitamin D deficiency, unspecified: Secondary | ICD-10-CM | POA: Diagnosis not present

## 2018-02-11 DIAGNOSIS — M810 Age-related osteoporosis without current pathological fracture: Secondary | ICD-10-CM | POA: Diagnosis not present

## 2018-02-15 DIAGNOSIS — M47816 Spondylosis without myelopathy or radiculopathy, lumbar region: Secondary | ICD-10-CM | POA: Diagnosis not present

## 2018-02-15 DIAGNOSIS — M810 Age-related osteoporosis without current pathological fracture: Secondary | ICD-10-CM | POA: Diagnosis not present

## 2018-02-16 DIAGNOSIS — I4819 Other persistent atrial fibrillation: Secondary | ICD-10-CM | POA: Diagnosis not present

## 2018-02-16 DIAGNOSIS — E039 Hypothyroidism, unspecified: Secondary | ICD-10-CM | POA: Diagnosis not present

## 2018-02-16 DIAGNOSIS — D649 Anemia, unspecified: Secondary | ICD-10-CM | POA: Diagnosis not present

## 2018-02-16 DIAGNOSIS — K7469 Other cirrhosis of liver: Secondary | ICD-10-CM | POA: Diagnosis not present

## 2018-02-16 DIAGNOSIS — Z79899 Other long term (current) drug therapy: Secondary | ICD-10-CM | POA: Diagnosis not present

## 2018-02-16 DIAGNOSIS — N183 Chronic kidney disease, stage 3 (moderate): Secondary | ICD-10-CM | POA: Diagnosis not present

## 2018-02-23 ENCOUNTER — Ambulatory Visit (INDEPENDENT_AMBULATORY_CARE_PROVIDER_SITE_OTHER): Payer: Medicare Other | Admitting: Gastroenterology

## 2018-02-23 ENCOUNTER — Encounter: Payer: Self-pay | Admitting: Gastroenterology

## 2018-02-23 VITALS — BP 144/54 | HR 80 | Ht 64.0 in | Wt 161.5 lb

## 2018-02-23 DIAGNOSIS — R1310 Dysphagia, unspecified: Secondary | ICD-10-CM | POA: Diagnosis not present

## 2018-02-23 DIAGNOSIS — D500 Iron deficiency anemia secondary to blood loss (chronic): Secondary | ICD-10-CM

## 2018-02-23 DIAGNOSIS — R195 Other fecal abnormalities: Secondary | ICD-10-CM

## 2018-02-23 NOTE — Patient Instructions (Addendum)
If you are age 83 or older, your body mass index should be between 23-30. Your Body mass index is 27.72 kg/m. If this is out of the aforementioned range listed, please consider follow up with your Primary Care Provider.  If you are age 68 or younger, your body mass index should be between 19-25. Your Body mass index is 27.72 kg/m. If this is out of the aformentioned range listed, please consider follow up with your Primary Care Provider.   You have been scheduled for a Barium Esophogram at [facility] (1st floor of the hospital) on 03/02/18 at 11am. Please arrive 15 minutes prior to your appointment for registration. Make certain not to have anything to eat or drink 3 hours prior to your test. If you need to reschedule for any reason, please contact radiology at [phone] to do so. __________________________________________________________________ A barium swallow is an examination that concentrates on views of the esophagus. This tends to be a double contrast exam (barium and two liquids which, when combined, create a gas to distend the wall of the oesophagus) or single contrast (non-ionic iodine based). The study is usually tailored to your symptoms so a good history is essential. Attention is paid during the study to the form, structure and configuration of the esophagus, looking for functional disorders (such as aspiration, dysphagia, achalasia, motility and reflux) EXAMINATION You may be asked to change into a gown, depending on the type of swallow being performed. A radiologist and radiographer will perform the procedure. The radiologist will advise you of the type of contrast selected for your procedure and direct you during the exam. You will be asked to stand, sit or lie in several different positions and to hold a small amount of fluid in your mouth before being asked to swallow while the imaging is performed .In some instances you may be asked to swallow barium coated marshmallows to  assess the motility of a solid food bolus. The exam can be recorded as a digital or video fluoroscopy procedure. POST PROCEDURE It will take 1-2 days for the barium to pass through your system. To facilitate this, it is important, unless otherwise directed, to increase your fluids for the next 24-48hrs and to resume your normal diet.  This test typically takes about 30 minutes to perform. __________________________________________________________________________________   It has been recommended to you by your physician that you have a(n) EGD/Colonoscopy completed. We did not schedule the procedure(s) today. Please contact our office at [phone] to have the procedure completed.   You will be contacted by our office prior to your procedure for directions on holding your [place].  If you do not hear from our office 1 week prior to your scheduled procedure, please call [phone] to discuss.   Please go to the lab on the 2nd floor [address]  for your scheduled appointment for labwork.   Thank you,  Dr. Jackquline Denmark

## 2018-02-23 NOTE — Progress Notes (Signed)
Chief Complaint: Heme positive stools with anemia  Referring Provider:  Raina Mina., MD      ASSESSMENT AND PLAN;   #1. IDA with heme positive stools.  Drop in Hb from 12.5 (Oct 2019) to 9.6 (02/16/2018).  Xarelto on hold. #2. FH colon cancer (son) #3. 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 No portal hypertension. Carrier for hemochromatosis. #4. GERD with occ dysphagia.  H/O eso dil in past. #5. Comorbid conditions including Afib on amiodarone, hypothyroidism, advanced age.  Plan: - Omeprazole 20mg  po qd. - Ba swallow with Ba tab (as per Dr Lynann Beaver recommnedadtions), Will arrange. - Proceed with EGD and colonoscopy after cardiology clearence from Dr Margrett Rud.  I have discussed the risks & benefits.  The risks including risk of perforation requiring laparotomy, bleeding after biopsies/polypectomy requiring blood transfusion and risks of anesthesia/sedation were discussed. Consent forms given for review.  Patient and patient's daughter also understands that there is a small but definite risk of stroke as Xarelto is on hold.  The risk of bleeding is more imminent. - Trend CBC.  - If still with problems and the above work-up is negative, proceed with CT scan abdo/pelvis. - She does not want to go off amiodarone as she is doing very well from cardiac standpoint.  For now, we will continue to monitor for liver disease.  Once the above work-up is complete, we will discuss with Dr. Margrett Rud.   HPI:    Deborah Ball is a 85 y.o. female  Referred by Dr. Bea Graff  for anemia and heme positive stools.  Found to have Hb of 9.6 when she presented to Ortho for routine Prolia injection., Hb was 12.5 11/19/2017.  Repeat hemoglobin was 9.6 stable).  Iron studies consistent with iron deficiency anemia.  Xarelto was discontinued.  No significant GI complaints except for occasional dysphagia to pills.  Seen Dr. Gaylyn Cheers.  Had negative ENT evaluation including negative nasopharyngoscopy.   He was planning to get a barium swallow d/t intermittent dysphagia mostly to pills. Patient has history of esophageal dilatation several years ago.  Denies having any melena or hematochezia at the present time.  No abdominal pain.  No fever chills night sweats or any weight loss.  She denies use of nonsteroidals (she is allergic to aspirin).  Denies any excessive weakness or dizziness.  Had colonoscopy performed x 4 previously.  Her last colonoscopy was at age 32 in [place].   Has been taking iron supplements 3 times a week.  Accompanied by her daughter.  Had mild cirrhosis on CT chest imaging.  Her son had liver cirrhosis and colon cancer.  He did eventually require liver transplantation.  No history of itching, skin lesions, intake of over-the-counter medications including diet pills, herbal medications, anabolic steroids or Tylenol.  There is no history of blood transfusions, IV drug use. No jaundice dark urine or pale stools.  No history of alcohol abuse.  Has been tested as heterozygous for hemochromatosis.    Past Medical History:  Diagnosis Date  . A-fib ([place])   . Cancer ([place])    hx R breast cancer, R eye cancer-squamous cell 2018  . Congestive heart failure ([place])   . Diverticulitis   . Hypertension   . Thyroid disease     Past Surgical History:  Procedure Laterality Date  . ABDOMINAL HYSTERECTOMY    . ABDOMINAL SURGERY    . APPENDECTOMY    . BLADDER SURGERY     x2 bvladder take up  .  BREAST LUMPECTOMY Bilateral    2 times   . CARDIOVERSION     x2 2017/2018  . CHOLECYSTECTOMY    . COLONOSCOPY     around 2005-2006 age 75   . ESOPHAGOGASTRODUODENOSCOPY     around early 2000's   . FEMUR SURGERY    . HIP SURGERY     rod in place on right side     Family History  Problem Relation Age of Onset  . Colon cancer Child   . Esophageal cancer Neg Hx     Social History   Tobacco Use  . Smoking status: Never Smoker  . Smokeless tobacco: Never Used  Substance Use  Topics  . Alcohol use: Not Currently  . Drug use: Never    Current Outpatient Medications  Medication Sig Dispense Refill  . amiodarone (PACERONE) 200 MG tablet Take 200 mg by mouth daily.    . Cholecalciferol (VITAMIN D3 PO) Take 1 tablet by mouth daily.    . Denosumab (PROLIA [place]) Inject into the skin every 6 (six) months.    . diltiazem (CARTIA XT) 240 MG 24 hr capsule Take 240 mg by mouth daily.     . furosemide (LASIX) 20 MG tablet Take 20 mg by mouth as needed.     Marland Kitchen levothyroxine (SYNTHROID, LEVOTHROID) 50 MCG tablet Take 50 mcg by mouth daily before breakfast.     . mupirocin ointment (BACTROBAN) 2 % Place 1 application into the nose as needed (for skin wounds).     . potassium chloride (K-DUR,KLOR-CON) 10 MEQ tablet Take 20 mEq by mouth as needed.     Marland Kitchen Propylene Glycol (SYSTANE BALANCE) 0.6 % SOLN Place 1 drop into both eyes as needed (for dry eyes).     . rivaroxaban (XARELTO) 20 MG TABS tablet Take 20 mg by mouth daily with supper.      No current facility-administered medications for this visit.     Allergies  Allergen Reactions  . Other Anaphylaxis and Other (See Comments)    Phenbarbitol-unknown reaction, but pt had been told allergic Contrast Dye   . Statins Other (See Comments)    Other reaction(s): OTHER Joint pain  Other reaction(s): OTHER   . Sulfur Rash  . Clarithromycin Other (See Comments)    Joint pain   . Iodinated Diagnostic Agents Other (See Comments)    Tremors and shaking Other reaction(s): OTHER Shaking   . Morphine Other (See Comments)    Other reaction(s): Other (See Comments) AMS, Agitation   . Aspirin     Other reaction(s): OTHER  . Ciprofloxacin     Other reaction(s): OTHER  . Food     NUTS AND SEEDS == DUE TO diverticulitis  DOES NOT EAT CHOCOLATE OR TAKE CAFFEINE PRODUCTS  . Penicillins   . Phenobarbital     Other reaction(s): UNKNOWN  . Sulfa Antibiotics     Other reaction(s): RASH Other reaction(s): RASH    Review of  Systems:  Constitutional: Denies fever, chills, diaphoresis, appetite change and fatigue.  HEENT: Denies photophobia, eye pain, redness, hearing loss, ear pain, congestion, sore throat, rhinorrhea, sneezing, mouth sores, neck pain, neck stiffness and tinnitus.   Respiratory: Denies SOB, DOE, cough, chest tightness,  and wheezing.   Cardiovascular: Denies chest pain, palpitations and leg swelling.  Genitourinary: Denies dysuria, urgency, frequency, hematuria, flank pain and difficulty urinating.  Musculoskeletal: Denies myalgias, back pain, joint swelling, arthralgias and gait problem.  Skin: No rash.  Neurological: Denies dizziness, seizures, syncope, weakness, light-headedness, numbness  and headaches.  Hematological: Denies adenopathy. Has Easy bruising.  Psychiatric/Behavioral: No anxiety or depression     Physical Exam:    Ht 5\' 4"  (1.626 m)   Wt 161 lb 8 oz (73.3 kg)   BMI 27.72 kg/m  Filed Weights   02/23/18 0953  Weight: 161 lb 8 oz (73.3 kg)   Constitutional:  Well-developed, in no acute distress. Psychiatric: Normal mood and affect. Behavior is normal. HEENT: Pupils normal.  Conjunctivae are normal. No scleral icterus. Neck supple.  Cardiovascular: Normal rate, regular rhythm. No edema Pulmonary/chest: Effort normal and breath sounds normal. No wheezing, rales or rhonchi. Abdominal: Soft, nondistended. Nontender. Bowel sounds active throughout. There are no masses palpable. No hepatomegaly. Rectal:  defered Neurological: Alert and oriented to person place and time. Skin: Skin is warm and dry. No rashes noted.  Has bruises.  Data Reviewed: I have personally reviewed following labs and imaging studies  CBC: CBC Latest Ref Rng & Units 09/29/2016 09/26/2016 09/23/2016  WBC 4.0 - 10.5 K/uL 7.3 7.6 6.4  Hemoglobin 12.0 - 15.0 g/dL 11.6(L) 11.5(L) 11.6(L)  Hematocrit 36.0 - 46.0 % 35.2(L) 36.2 36.0  Platelets 150 - 400 K/uL 406(H) 435(H) 385    CMP: CMP Latest Ref Rng &  Units 09/26/2016 09/23/2016 10/31/2015  Glucose 65 - 99 mg/dL 126(H) 104(H) 104(H)  BUN 6 - 20 mg/dL 15 14 8   Creatinine 0.44 - 1.00 mg/dL 0.90 0.78 0.71  Sodium 135 - 145 mmol/L 142 140 138  Potassium 3.5 - 5.1 mmol/L 4.1 4.1 3.7  Chloride 101 - 111 mmol/L 108 108 106  CO2 22 - 32 mmol/L 26 25 26   Calcium 8.9 - 10.3 mg/dL 8.6(L) 8.5(L) 8.7(L)  Total Protein 6.5 - 8.1 g/dL 6.5 6.3(L) 6.4(L)  Total Bilirubin 0.3 - 1.2 mg/dL 0.7 1.2 0.8  Alkaline Phos 38 - 126 U/L 103 77 69  AST 15 - 41 U/L 34 33 16  ALT 14 - 54 U/L 29 27 14      Carmell Austria, MD 02/23/2018, 10:09 AM  Cc: Raina Mina., MD

## 2018-02-24 ENCOUNTER — Ambulatory Visit: Payer: Medicare Other | Admitting: Sports Medicine

## 2018-02-24 ENCOUNTER — Telehealth: Payer: Self-pay | Admitting: Gastroenterology

## 2018-02-24 NOTE — Telephone Encounter (Signed)
Pt daughter called in and wanted to know since mother is going to pcp on the 2/25 can the labs from that vist be sent to dr.Gupta so that she would not have to be stuck twice??

## 2018-02-28 ENCOUNTER — Telehealth: Payer: Self-pay

## 2018-02-28 DIAGNOSIS — L570 Actinic keratosis: Secondary | ICD-10-CM | POA: Diagnosis not present

## 2018-02-28 DIAGNOSIS — L578 Other skin changes due to chronic exposure to nonionizing radiation: Secondary | ICD-10-CM | POA: Diagnosis not present

## 2018-02-28 DIAGNOSIS — L821 Other seborrheic keratosis: Secondary | ICD-10-CM | POA: Diagnosis not present

## 2018-03-01 NOTE — Telephone Encounter (Signed)
Called and spoke with patient's daughter-patient's daughter informed that lab work is a CBC and she can have the PCP order/perform the requested lab work as long as results can be sent to Dr. Lyndel Safe in time for review for appt;  Also during this phone call the daughter has requested a follow up appt be scheduled for the patient to be seen by Dr. Lyndel Safe- this appt was scheduled for 03/25/2018 at 4:00 pm; patient was also pre scheduled for an EGD/colonoscopy for 03/28/2018 and will need to be given instructions for procedure;  Patient's daughter reports the patient has been off of Xarelto for "almost a month now and was told not to go back on the medication until after she sees Dr. Lyndel Safe"; patient's daughter was informed cardiology clearance would still need to be obtained   Patient's daughter verbalized understanding of information/instructions;  Patient's daughter was advised to call the office at [phone] if questions/concerns arise;   Please advise if there is additional steps needed for this patient;

## 2018-03-02 ENCOUNTER — Ambulatory Visit (HOSPITAL_COMMUNITY)
Admission: RE | Admit: 2018-03-02 | Discharge: 2018-03-02 | Disposition: A | Discharge status: Home / Self Care | Payer: Medicare Other | Source: Ambulatory Visit | Attending: Gastroenterology | Admitting: Gastroenterology

## 2018-03-02 DIAGNOSIS — R1310 Dysphagia, unspecified: Secondary | ICD-10-CM | POA: Diagnosis not present

## 2018-03-02 DIAGNOSIS — K449 Diaphragmatic hernia without obstruction or gangrene: Secondary | ICD-10-CM | POA: Diagnosis not present

## 2018-03-03 ENCOUNTER — Encounter: Payer: Self-pay | Admitting: Sports Medicine

## 2018-03-03 ENCOUNTER — Ambulatory Visit (INDEPENDENT_AMBULATORY_CARE_PROVIDER_SITE_OTHER): Payer: Medicare Other | Admitting: Sports Medicine

## 2018-03-03 VITALS — BP 151/46 | HR 81 | Resp 16

## 2018-03-03 DIAGNOSIS — M79672 Pain in left foot: Secondary | ICD-10-CM | POA: Diagnosis not present

## 2018-03-03 DIAGNOSIS — L84 Corns and callosities: Secondary | ICD-10-CM

## 2018-03-03 DIAGNOSIS — I739 Peripheral vascular disease, unspecified: Secondary | ICD-10-CM

## 2018-03-03 DIAGNOSIS — B351 Tinea unguium: Secondary | ICD-10-CM

## 2018-03-03 DIAGNOSIS — Z7901 Long term (current) use of anticoagulants: Secondary | ICD-10-CM

## 2018-03-03 DIAGNOSIS — M79671 Pain in right foot: Secondary | ICD-10-CM

## 2018-03-03 NOTE — Progress Notes (Signed)
Subjective: Jumana Paccione is a 85 y.o. female patient seen today in office with complaint of mildly painful thickened and elongated toenails and  mildly painful corn on bottom of right foot>left 1st toe; unable to trim. Patient is assisted by caregiver who in in waiting room.  Patient denies any other pedal complaints at this time.  Patient denies any changes with medication or health since last visit except having to stop her Xarelto due to GI bleeding.   There are no active problems to display for this patient.    Current Outpatient Medications:  .  amiodarone (PACERONE) 200 MG tablet, Take 200 mg by mouth daily., Disp: , Rfl:  .  Cholecalciferol (VITAMIN D3 PO), Take 1 tablet by mouth daily., Disp: , Rfl:  .  Denosumab (PROLIA [place]), Inject into the skin every 6 (six) months., Disp: , Rfl:  .  diltiazem (CARTIA XT) 240 MG 24 hr capsule, Take 240 mg by mouth daily. , Disp: , Rfl:  .  furosemide (LASIX) 20 MG tablet, Take 20 mg by mouth as needed. , Disp: , Rfl:  .  levothyroxine (SYNTHROID, LEVOTHROID) 50 MCG tablet, Take 50 mcg by mouth daily before breakfast. , Disp: , Rfl:  .  mupirocin ointment (BACTROBAN) 2 %, Place 1 application into the nose as needed (for skin wounds). , Disp: , Rfl:  .  potassium chloride (K-DUR,KLOR-CON) 10 MEQ tablet, Take 20 mEq by mouth as needed. , Disp: , Rfl:  .  potassium chloride (KLOR-CON) 20 MEQ packet, Take by mouth., Disp: , Rfl:  .  Propylene Glycol (SYSTANE BALANCE) 0.6 % SOLN, Place 1 drop into both eyes as needed (for dry eyes). , Disp: , Rfl:  .  trimethoprim-polymyxin b (POLYTRIM) ophthalmic solution, INSTILL 1 GTT IN OU QID FOR 10 DAYS, Disp: , Rfl:   Allergies  Allergen Reactions  . Other Anaphylaxis and Other (See Comments)    Phenbarbitol-unknown reaction, but pt had been told allergic Contrast Dye   . Statins Other (See Comments)    Other reaction(s): OTHER Joint pain  Other reaction(s): OTHER   . Sulfur Rash  . Clarithromycin Other  (See Comments)    Joint pain   . Iodinated Diagnostic Agents Other (See Comments)    Tremors and shaking Other reaction(s): OTHER Shaking   . Morphine Other (See Comments)    Other reaction(s): Other (See Comments) AMS, Agitation   . Aspirin     Other reaction(s): OTHER  . Ciprofloxacin     Other reaction(s): OTHER  . Food     NUTS AND SEEDS == DUE TO diverticulitis  DOES NOT EAT CHOCOLATE OR TAKE CAFFEINE PRODUCTS  . Penicillins   . Phenobarbital     Other reaction(s): UNKNOWN  . Sulfa Antibiotics     Other reaction(s): RASH Other reaction(s): RASH    Objective: Physical Exam  General: Well developed, nourished, no acute distress, awake, alert and oriented x 3  Vascular: Dorsalis pedis artery 1/4 bilateral, Posterior tibial artery 0/4 bilateral due to trace edema at ankles, skin temperature warm to warm proximal to distal bilateral lower extremities, + varicosities, scant pedal hair present bilateral.  Neurological: Gross sensation present via light touch bilateral.   Dermatological: Skin is warm, dry, and supple bilateral, Nails 1-10 are tender, long, thick, and discolored with mild subungal debris, no webspace macerations present bilateral, no open lesions present bilateral, + hyperkeratotic tissue present right sub met 1 and medial great toe on left minimal at today's visit. No signs of  infection bilateral.  Musculoskeletal: Mild pain to callus areas on right foot. Asymptomatic hammertoe boney deformities noted bilateral. Muscular strength within normal limits without pain on range of motion. No pain with calf compression bilateral.  Assessment and Plan:  Problem List Items Addressed This Visit    None    Visit Diagnoses    Dermatophytosis of nail    -  Primary   Corns and callosities       Foot pain, bilateral       PVD (peripheral vascular disease) ([place])       Current use of long term anticoagulation          -Examined patient.  -Discussed treatment  options for painful mycotic nails and callus. -Mechanically debrided callus x 2 using sterile chisel blade at no additional charge and reduced mycotic nails with sterile nail nipper and dremel nail file without incident. -Patient to return in 3 months for follow up evaluation or sooner if symptoms worsen.  Landis Martins, DPM

## 2018-03-04 NOTE — Telephone Encounter (Signed)
Sent letter to Dr. Ranell Patrick regarding patients Xarelto.

## 2018-03-14 NOTE — Telephone Encounter (Signed)
Please see comment/plan as per last clinic note. cardiology clearence from Dr Margrett Rud for procedures and to hold [facility]

## 2018-03-15 NOTE — Telephone Encounter (Signed)
I have received the cardiac clearance per Dr. Margrett Rud it is ok to hold the Xarelto for 3-5 days. How many days would you like to hold this Dr. Lyndel Safe?

## 2018-03-15 NOTE — Telephone Encounter (Signed)
3 days would be good enough Thanks for your help

## 2018-03-15 NOTE — Telephone Encounter (Signed)
As of 03/15/2018-patient is still scheduled for procedure after OV (per daughter's request) to discuss need for procedure and for instructions to be given for procedure at OV;   Trisha-Still awaiting clearance from PCP for hold on Xarelto-please obtain update if possible

## 2018-03-15 NOTE — Telephone Encounter (Signed)
I have called the patient and spoke to her daughter and let her know per Dr. Margrett Rud she was to hold the Xarelto 3 days before the procedure.

## 2018-03-21 DIAGNOSIS — I509 Heart failure, unspecified: Secondary | ICD-10-CM | POA: Diagnosis not present

## 2018-03-21 DIAGNOSIS — I82812 Embolism and thrombosis of superficial veins of left lower extremities: Secondary | ICD-10-CM | POA: Diagnosis not present

## 2018-03-21 DIAGNOSIS — E785 Hyperlipidemia, unspecified: Secondary | ICD-10-CM | POA: Diagnosis not present

## 2018-03-21 DIAGNOSIS — E039 Hypothyroidism, unspecified: Secondary | ICD-10-CM | POA: Diagnosis not present

## 2018-03-21 DIAGNOSIS — I4891 Unspecified atrial fibrillation: Secondary | ICD-10-CM | POA: Diagnosis not present

## 2018-03-21 DIAGNOSIS — Z7901 Long term (current) use of anticoagulants: Secondary | ICD-10-CM | POA: Diagnosis not present

## 2018-03-21 DIAGNOSIS — I10 Essential (primary) hypertension: Secondary | ICD-10-CM | POA: Diagnosis not present

## 2018-03-21 DIAGNOSIS — Z79899 Other long term (current) drug therapy: Secondary | ICD-10-CM | POA: Diagnosis not present

## 2018-03-21 DIAGNOSIS — I110 Hypertensive heart disease with heart failure: Secondary | ICD-10-CM | POA: Diagnosis not present

## 2018-03-22 ENCOUNTER — Telehealth: Payer: Self-pay | Admitting: Gastroenterology

## 2018-03-22 DIAGNOSIS — K7469 Other cirrhosis of liver: Secondary | ICD-10-CM | POA: Diagnosis not present

## 2018-03-22 DIAGNOSIS — I82890 Acute embolism and thrombosis of other specified veins: Secondary | ICD-10-CM | POA: Diagnosis not present

## 2018-03-22 DIAGNOSIS — I4819 Other persistent atrial fibrillation: Secondary | ICD-10-CM | POA: Diagnosis not present

## 2018-03-22 DIAGNOSIS — D509 Iron deficiency anemia, unspecified: Secondary | ICD-10-CM | POA: Diagnosis not present

## 2018-03-22 DIAGNOSIS — N183 Chronic kidney disease, stage 3 (moderate): Secondary | ICD-10-CM | POA: Diagnosis not present

## 2018-03-22 NOTE — Telephone Encounter (Signed)
Pt requested to cancel lab sched tomorrow 2.26.20.

## 2018-03-22 NOTE — Telephone Encounter (Signed)
I have called the patient, waiting for a returned phone call.

## 2018-03-23 ENCOUNTER — Other Ambulatory Visit: Payer: Medicare Other

## 2018-03-23 NOTE — Telephone Encounter (Signed)
Called patient, waiting for a returned call.

## 2018-03-25 ENCOUNTER — Encounter: Payer: Self-pay | Admitting: Gastroenterology

## 2018-03-25 ENCOUNTER — Ambulatory Visit (INDEPENDENT_AMBULATORY_CARE_PROVIDER_SITE_OTHER): Payer: Medicare Other | Admitting: Gastroenterology

## 2018-03-25 VITALS — BP 156/58 | HR 73 | Ht 64.0 in | Wt 164.0 lb

## 2018-03-25 DIAGNOSIS — R195 Other fecal abnormalities: Secondary | ICD-10-CM

## 2018-03-25 DIAGNOSIS — D500 Iron deficiency anemia secondary to blood loss (chronic): Secondary | ICD-10-CM

## 2018-03-25 DIAGNOSIS — R1310 Dysphagia, unspecified: Secondary | ICD-10-CM | POA: Diagnosis not present

## 2018-03-25 NOTE — Progress Notes (Signed)
Chief Complaint: Heme positive stools with anemia  Referring Provider:  Raina Mina., MD      ASSESSMENT AND PLAN;   #1. IDA with heme positive stools.  Drop in Hb from 12.5 (Oct 2019) to 9.6 (02/16/2018) to Hb 10.2 (03/22/2018).  Xarelto on hold. #2. FH colon cancer (son) #3. 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 No portal hypertension. Carrier for hemochromatosis. Pt on amioadrone #4. GERD with occ dysphagia.  H/O eso dil in past. Ba swallow 02/2018-small hiatal hernia, nonspecific esophageal dysmotility.  Questionable polypoid filling defects in the distal esophagus. #5. Comorbid conditions including Afib on amiodarone, hypothyroidism, advanced age.  Plan: - Omeprazole 20mg  po qd. - Proceed with EGD with dil and colonoscopy with miralax after cardiology clearence from Dr Margrett Rud.  I have discussed the risks & benefits.  The risks including risk of perforation requiring laparotomy, bleeding after biopsies/polypectomy requiring blood transfusion and risks of anesthesia/sedation were discussed. Consent forms given for review.  Patient and patient's daughter also understands that there is a small but definite risk of stroke as Xarelto is on hold.  The risk of bleeding is more imminent. - Trend CBC.  - CT scan abdo/pelvis therafter. - She does not want to go off amiodarone as she is doing very well from cardiac standpoint.  For now, we will continue to monitor for liver disease.  Once the above work-up is complete, we will discuss with Dr. Margrett Rud.   HPI:    Deborah Ball is a 85 y.o. female  Referred by Dr. Bea Graff  for anemia and heme positive stools.  Found to have Hb of 9.6 when she presented to Ortho for routine Prolia injection., Hb was 12.5 11/19/2017.  Repeat hemoglobin was 9.6 stable).  Iron studies consistent with iron deficiency anemia.  Xarelto was discontinued.   Rpt Hb 10.2 (03/22/2018) with NL LFTs and plts but with alb 3.4.  No significant GI  complaints except for occasional dysphagia to pills.  Seen Dr. Gaylyn Cheers.  Had negative ENT evaluation including negative nasopharyngoscopy.  He was planning to get a barium swallow d/t intermittent dysphagia mostly to pills. Patient has history of esophageal dilatation several years ago.  Denies having any melena or hematochezia at the present time.  No abdominal pain.  No fever chills night sweats or any weight loss.  She denies use of nonsteroidals (she is allergic to aspirin).  Denies any excessive weakness or dizziness.  Had colonoscopy performed x 4 previously.  Her last colonoscopy was at age 49 in [place].   Has been taking iron supplements 3 times a week.  Accompanied by her daughter.  Had mild cirrhosis on CT chest imaging.  Her son had liver cirrhosis and colon cancer.  He did eventually require liver transplantation.  No history of itching, skin lesions, intake of over-the-counter medications including diet pills, herbal medications, anabolic steroids or Tylenol.  There is no history of blood transfusions, IV drug use. No jaundice dark urine or pale stools.  No history of alcohol abuse.  Has been tested as heterozygous for hemochromatosis.   Ba Swallow 03/02/2018 IMPRESSION: 1. Mild nonspecific esophageal motility disorder with occasional mild tertiary contractions. 2. Small polypoid filling defect in the distal third of the esophagus at or immediately before the gastroesophageal junction. This does not appear obstructive at this time. If clinically appropriate, further evaluation with endoscopy could be considered. 3. Small hiatal hernia with gastroesophageal reflux. Past Medical History:  Diagnosis Date  . A-fib ([place])   .  Arrhythmia   . Arthritis   . Cancer ([place])    hx R breast cancer, R eye cancer-squamous cell 2018  . Chronic liver disease   . Cirrhosis of liver ([facility])    Mild per patient's daughter  . Congestive heart failure ([place])   . Diverticulitis   . Diverticulosis   .  Esophageal stricture    said she had her esophageal stretched once   . Gallstones   . GERD (gastroesophageal reflux disease)   . Hemochromatosis   . History of blood clots    left leg, superficial thrombous 03/21/2018 wasn't giving anything. was told how to care for it   . History of colon polyps   . Hyperlipidemia   . Hypertension   . IBS (irritable bowel syndrome)   . Kidney stones   . Thyroid disease     Past Surgical History:  Procedure Laterality Date  . ABDOMINAL HYSTERECTOMY    . ABDOMINAL SURGERY    . APPENDECTOMY    . BLADDER SURGERY     x2 bladder tack  . BREAST LUMPECTOMY Bilateral    2 times   . CARDIOVERSION     x2 2017/2018  . CHOLECYSTECTOMY    . COLONOSCOPY     around 2005-2006 age 75   . ESOPHAGOGASTRODUODENOSCOPY     around early 2000's   . FEMUR SURGERY    . HIP SURGERY     rod in place on right side     Family History  Problem Relation Age of Onset  . Colon cancer Child   . Skin cancer Child   . Other Child        liver transplant   . Colon polyps Child   . Liver disease Child   . Heart disease Father   . Prostate cancer Brother   . Skin cancer Daughter   . Colon polyps Daughter   . Liver disease Maternal Aunt        died from liver failure per patient's daughter  . Other Cousin        liver transplant. On mom's side   . Colon polyps Daughter   . Esophageal cancer Neg Hx     Social History   Tobacco Use  . Smoking status: Never Smoker  . Smokeless tobacco: Never Used  Substance Use Topics  . Alcohol use: Not Currently  . Drug use: Never    Current Outpatient Medications  Medication Sig Dispense Refill  . amiodarone (PACERONE) 200 MG tablet Take 200 mg by mouth daily.    . Cholecalciferol (VITAMIN D3 PO) Take 1 tablet by mouth daily.    . Denosumab (PROLIA [place]) Inject into the skin every 6 (six) months.    . diltiazem (CARTIA XT) 240 MG 24 hr capsule Take 240 mg by mouth daily.     . furosemide (LASIX) 20 MG tablet Take 20 mg by  mouth as needed.     Marland Kitchen levothyroxine (SYNTHROID, LEVOTHROID) 50 MCG tablet Take 50 mcg by mouth daily before breakfast.     . mupirocin ointment (BACTROBAN) 2 % Place 1 application into the nose as needed (for skin wounds).     . potassium chloride (K-DUR,KLOR-CON) 10 MEQ tablet Take 20 mEq by mouth as needed.     . potassium chloride (KLOR-CON) 20 MEQ packet Take by mouth.    . Propylene Glycol (SYSTANE BALANCE) 0.6 % SOLN Place 1 drop into both eyes as needed (for dry eyes).     . trimethoprim-polymyxin b (  POLYTRIM) ophthalmic solution INSTILL 1 GTT IN OU QID FOR 10 DAYS     No current facility-administered medications for this visit.     Allergies  Allergen Reactions  . Other Anaphylaxis and Other (See Comments)    Phenbarbitol-unknown reaction, but pt had been told allergic Contrast Dye   . Statins Other (See Comments)    Other reaction(s): OTHER Joint pain  Other reaction(s): OTHER   . Sulfur Rash  . Clarithromycin Other (See Comments)    Joint pain   . Iodinated Diagnostic Agents Other (See Comments)    Tremors and shaking Other reaction(s): OTHER Shaking   . Morphine Other (See Comments)    Other reaction(s): Other (See Comments) AMS, Agitation   . Aspirin     Bruising per patient  . Ciprofloxacin     Other reaction(s): OTHER  . Food     NUTS AND SEEDS == DUE TO diverticulitis  DOES NOT EAT CHOCOLATE OR TAKE CAFFEINE PRODUCTS  . Penicillins   . Phenobarbital     Other reaction(s): UNKNOWN  . Sulfa Antibiotics     Other reaction(s): RASH Other reaction(s): RASH    Review of Systems:  Constitutional: Denies fever, chills, diaphoresis, appetite change and fatigue.  HEENT: Denies photophobia, eye pain, redness, hearing loss, ear pain, congestion, sore throat, rhinorrhea, sneezing, mouth sores, neck pain, neck stiffness and tinnitus.   Respiratory: Denies SOB, DOE, cough, chest tightness,  and wheezing.   Cardiovascular: Denies chest pain, palpitations and leg  swelling.  Genitourinary: Denies dysuria, urgency, frequency, hematuria, flank pain and difficulty urinating.  Musculoskeletal: Denies myalgias, back pain, joint swelling, arthralgias and gait problem.  Skin: No rash.  Neurological: Denies dizziness, seizures, syncope, weakness, light-headedness, numbness and headaches.  Hematological: Denies adenopathy. Has Easy bruising.  Psychiatric/Behavioral: No anxiety or depression     Physical Exam:    BP (!) 156/58   Pulse 73   Ht 5\' 4"  (1.626 m)   Wt 164 lb (74.4 kg)   BMI 28.15 kg/m  Filed Weights   03/25/18 1306  Weight: 164 lb (74.4 kg)   Constitutional:  Well-developed, in no acute distress. Psychiatric: Normal mood and affect. Behavior is normal. HEENT: Pupils normal.  Conjunctivae are normal. No scleral icterus. Neck supple.  Cardiovascular: Normal rate, regular rhythm. No edema Pulmonary/chest: Effort normal and breath sounds normal. No wheezing, rales or rhonchi. Abdominal: Soft, nondistended. Nontender. Bowel sounds active throughout. There are no masses palpable. No hepatomegaly. Rectal:  defered Neurological: Alert and oriented to person place and time. Skin: Skin is warm and dry. No rashes noted.  Has bruises.  Data Reviewed: I have personally reviewed following labs and imaging studies  CBC: CBC Latest Ref Rng & Units 09/29/2016 09/26/2016 09/23/2016  WBC 4.0 - 10.5 K/uL 7.3 7.6 6.4  Hemoglobin 12.0 - 15.0 g/dL 11.6(L) 11.5(L) 11.6(L)  Hematocrit 36.0 - 46.0 % 35.2(L) 36.2 36.0  Platelets 150 - 400 K/uL 406(H) 435(H) 385    CMP: CMP Latest Ref Rng & Units 09/26/2016 09/23/2016 10/31/2015  Glucose 65 - 99 mg/dL 126(H) 104(H) 104(H)  BUN 6 - 20 mg/dL 15 14 8   Creatinine 0.44 - 1.00 mg/dL 0.90 0.78 0.71  Sodium 135 - 145 mmol/L 142 140 138  Potassium 3.5 - 5.1 mmol/L 4.1 4.1 3.7  Chloride 101 - 111 mmol/L 108 108 106  CO2 22 - 32 mmol/L 26 25 26   Calcium 8.9 - 10.3 mg/dL 8.6(L) 8.5(L) 8.7(L)  Total Protein 6.5 - 8.1  g/dL 6.5 6.3(L)  6.4(L)  Total Bilirubin 0.3 - 1.2 mg/dL 0.7 1.2 0.8  Alkaline Phos 38 - 126 U/L 103 77 69  AST 15 - 41 U/L 34 33 16  ALT 14 - 54 U/L 29 27 14      Carmell Austria, MD 03/25/2018, 1:17 PM  Cc: Raina Mina., MD

## 2018-03-25 NOTE — Patient Instructions (Signed)
If you are age 83 or older, your body mass index should be between 23-30. Your Body mass index is 28.15 kg/m. If this is out of the aforementioned range listed, please consider follow up with your Primary Care Provider.  If you are age 67 or younger, your body mass index should be between 19-25. Your Body mass index is 28.15 kg/m. If this is out of the aformentioned range listed, please consider follow up with your Primary Care Provider.   You have been scheduled for an endoscopy and colonoscopy. Please follow the written instructions given to you at your visit today. Please pick up your prep supplies at the pharmacy within the next 1-3 days. If you use inhalers (even only as needed), please bring them with you on the day of your procedure. Your physician has requested that you go to www.startemmi.com and enter the access code given to you at your visit today. This web site gives a general overview about your procedure. However, you should still follow specific instructions given to you by our office regarding your preparation for the procedure.   Thank you,  Dr. Jackquline Denmark

## 2018-03-28 ENCOUNTER — Ambulatory Visit (AMBULATORY_SURGERY_CENTER): Payer: Medicare Other | Admitting: Gastroenterology

## 2018-03-28 ENCOUNTER — Other Ambulatory Visit: Payer: Self-pay

## 2018-03-28 ENCOUNTER — Encounter: Payer: Self-pay | Admitting: Certified Registered Nurse Anesthetist

## 2018-03-28 VITALS — BP 119/80 | HR 77 | Temp 98.0°F | Resp 14 | Ht 64.0 in | Wt 161.0 lb

## 2018-03-28 DIAGNOSIS — K2210 Ulcer of esophagus without bleeding: Secondary | ICD-10-CM

## 2018-03-28 DIAGNOSIS — K449 Diaphragmatic hernia without obstruction or gangrene: Secondary | ICD-10-CM

## 2018-03-28 DIAGNOSIS — D120 Benign neoplasm of cecum: Secondary | ICD-10-CM | POA: Diagnosis not present

## 2018-03-28 DIAGNOSIS — R195 Other fecal abnormalities: Secondary | ICD-10-CM

## 2018-03-28 DIAGNOSIS — D122 Benign neoplasm of ascending colon: Secondary | ICD-10-CM | POA: Diagnosis not present

## 2018-03-28 DIAGNOSIS — I8500 Esophageal varices without bleeding: Secondary | ICD-10-CM

## 2018-03-28 DIAGNOSIS — D509 Iron deficiency anemia, unspecified: Secondary | ICD-10-CM | POA: Diagnosis not present

## 2018-03-28 DIAGNOSIS — K648 Other hemorrhoids: Secondary | ICD-10-CM | POA: Diagnosis not present

## 2018-03-28 DIAGNOSIS — R1310 Dysphagia, unspecified: Secondary | ICD-10-CM

## 2018-03-28 DIAGNOSIS — K222 Esophageal obstruction: Secondary | ICD-10-CM | POA: Diagnosis not present

## 2018-03-28 DIAGNOSIS — K5730 Diverticulosis of large intestine without perforation or abscess without bleeding: Secondary | ICD-10-CM

## 2018-03-28 DIAGNOSIS — K317 Polyp of stomach and duodenum: Secondary | ICD-10-CM

## 2018-03-28 DIAGNOSIS — K254 Chronic or unspecified gastric ulcer with hemorrhage: Secondary | ICD-10-CM | POA: Diagnosis not present

## 2018-03-28 MED ORDER — SODIUM CHLORIDE 0.9 % IV SOLN: 500.0000 mL | Freq: Once | INTRAVENOUS | Status: AC

## 2018-03-28 NOTE — Op Note (Signed)
[place] Patient Name: Deborah Ball Procedure Date: 03/28/2018 10:53 AM MRN: [id number] Endoscopist: Jackquline Denmark , MD Age: 85 Referring MD:  Date of Birth: 1922/03/22 Gender: Female Account #: [id number] Procedure:                Colonoscopy Indications:              Iron deficiency anemia, family history of colon                            cancer (son). History of colonic polyps. Medicines:                Monitored Anesthesia Care Procedure:                Pre-Anesthesia Assessment:                           - Prior to the procedure, a History and Physical                            was performed, and patient medications and                            allergies were reviewed. The patient's tolerance of                            previous anesthesia was also reviewed. The risks                            and benefits of the procedure and the sedation                            options and risks were discussed with the patient.                            All questions were answered, and informed consent                            was obtained. Prior Anticoagulants: The patient has                            taken no previous anticoagulant or antiplatelet                            agents. ASA Grade Assessment: III - A patient with                            severe systemic disease. After reviewing the risks                            and benefits, the patient was deemed in                            satisfactory condition to undergo the procedure.  After obtaining informed consent, the colonoscope                            was passed under direct vision. Throughout the                            procedure, the patient's blood pressure, pulse, and                            oxygen saturations were monitored continuously. The                            Colonoscope was introduced through the anus and                            advanced to the 2 cm into  the ileum. The                            colonoscopy was performed without difficulty. The                            patient tolerated the procedure well. The quality                            of the bowel preparation was good. The terminal                            ileum, ileocecal valve, appendiceal orifice, and                            rectum were photographed. Scope In: 11:09:12 AM Scope Out: 11:24:12 AM Scope Withdrawal Time: 0 hours 11 minutes 11 seconds  Total Procedure Duration: 0 hours 15 minutes 0 seconds  Findings:                 Four sessile polyps were found in the ascending                            colon and cecum. The polyps were 4 to 6 mm in size.                            These polyps were removed with a cold snare.                            Resection and retrieval were complete. Estimated                            blood loss: none.                           Four medium-sized angiodysplastic lesions, largest                            measuring 1.2 cm, without bleeding were found in  the ascending colon and in the cecum. Estimated                            blood loss: none.                           Multiple medium-mouthed diverticula were found in                            the sigmoid colon, descending colon and few                            scattered in ascending colon.                           Non-bleeding internal hemorrhoids were found during                            retroflexion and during perianal exam. The                            hemorrhoids were moderate.                           The terminal ileum appeared normal.                           The exam was otherwise without abnormality on                            direct and retroflexion views. Complications:            No immediate complications. Estimated Blood Loss:     Estimated blood loss: none. Impression:               -Small colonic polyps status post  polypectomy.                           -4 non-bleeding colonic angiodysplastic lesions.                           -Pancolonic diverticulosis predominantly in the                            sigmoid colon.                           -Mod internal hemorrhoids.                           -Otherwise normal colonoscopy to TI. Recommendation:           - Patient has a contact number available for                            emergencies. The signs and symptoms of potential  delayed complications were discussed with the                            patient. Return to normal activities tomorrow.                            Written discharge instructions were provided to the                            patient.                           - Resume previous diet.                           - Continue present medications.                           - Await pathology results.                           - Repeat colonoscopy only if she has any active                            bleeding with possible APC treatment at [facility]. Jackquline Denmark, MD 03/28/2018 11:34:57 AM This report has been signed electronically.

## 2018-03-28 NOTE — Progress Notes (Signed)
PT taken to PACU. Monitors in place. VSS. Report given to RN. 

## 2018-03-28 NOTE — Op Note (Addendum)
[place] Patient Name: Deborah Ball Procedure Date: 03/28/2018 10:53 AM MRN: [id number] Endoscopist: Jackquline Denmark , MD Age: 85 Referring MD:  Date of Birth: October 30, 1922 Gender: Female Account #: [id number] Procedure:                Upper GI endoscopy Indications:              #1. IDA with heme positive stools. Drop in Hb from                            12.5 (Oct 2019) to 9.6 (02/16/2018) to Hb 10.2                            (03/22/2018).                           #2. Cryptogenic liver cirrhosis- incidental                            finding. Carrier for hemochromatosis. Pt on                            amioadrone.                           #3. GERD with occ dysphagia. H/O eso dil in past.                            Ba swallow 02/2018-small hiatal hernia, nonspecific                            esophageal dysmotility. Questionable polypoid                            filling defects in the distal esophagus.                           #4. Comorbid conditions including Afib on                            amiodarone, hypothyroidism, advanced age. Medicines:                Monitored Anesthesia Care Procedure:                Pre-Anesthesia Assessment:                           - Prior to the procedure, a History and Physical                            was performed, and patient medications and                            allergies were reviewed. The patient's tolerance of                            previous anesthesia was also reviewed. The  risks                            and benefits of the procedure and the sedation                            options and risks were discussed with the patient.                            All questions were answered, and informed consent                            was obtained. Prior Anticoagulants: The patient has                            taken no previous anticoagulant or antiplatelet                            agents. ASA Grade Assessment: III - A  patient with                            severe systemic disease. After reviewing the risks                            and benefits, the patient was deemed in                            satisfactory condition to undergo the procedure.                           - Prior to the procedure, a History and Physical                            was performed, and patient medications and                            allergies were reviewed. The patient's tolerance of                            previous anesthesia was also reviewed. The risks                            and benefits of the procedure and the sedation                            options and risks were discussed with the patient.                            All questions were answered, and informed consent                            was obtained. Prior Anticoagulants: The patient has  taken Xarelto (rivaroxaban), last dose was more                            than 4 weeks prior to procedure. ASA Grade                            Assessment: III - A patient with severe systemic                            disease. After reviewing the risks and benefits,                            the patient was deemed in satisfactory condition to                            undergo the procedure.                           After obtaining informed consent, the endoscope was                            passed under direct vision. Throughout the                            procedure, the patient's blood pressure, pulse, and                            oxygen saturations were monitored continuously. The                            Endoscope was introduced through the mouth, and                            advanced to the second part of duodenum. The upper                            GI endoscopy was accomplished without difficulty.                            The patient tolerated the procedure well. Scope In: Scope Out: Findings:                  One Grade 0-I minimal varices were found in the                            lower third of the esophagus. Estimated blood loss:                            none.                           A mild Schatzki ring was found at the  gastroesophageal junction, 36 cm from the incisors                            with luminal diameter of 14 mm. The scope was                            withdrawn. Dilation was performed with a Maloney                            dilator with mild resistance at 48 Fr. Estimated                            blood loss: none.                           A small hiatal hernia was present.                           A single 12 mm pedunculated polyp with superficial                            ulcers (no active bleeding) was found in the hiatal                            hernia at the diaphragmatic hiatus, 1 cm below the                            GE junction. Superficial biopsies were carefully                            taken with a cold forceps for histology. Estimated                            blood loss was minimal. The remaining gastric                            mucosa was normal except for mild atrophic                            gastritis. The retroflexed examination did confirm                            hiatal hernia. No fundal varices.                           The examined duodenum was normal. Complications:            No immediate complications. Estimated Blood Loss:     Estimated blood loss: none. Impression:               - Very minimal esophageal varices.                           - Mild Schatzki ring. Dilated.                           -  Small hiatal hernia.                           - A single gastric polyp with superficial                            ulcerations (likely etiology of iron deficiency                            anemia). Biopsied. Recommendation:           - Patient has a contact number available for                             emergencies. The signs and symptoms of potential                            delayed complications were discussed with the                            patient. Return to normal activities tomorrow.                            Written discharge instructions were provided to the                            patient.                           - Post dilatation diet.                           - Increase omeprazole to 20 mg p.o. twice daily.                           - Await pathology results.                           - Would recommend EUS followed by gastric                            polypectomy at [facility]. I will discuss above with Dr.                            Rush Landmark.                           - Return to GI clinic in 4 weeks.                           - Trend CBC.                           - Of note that she has been taken off Xarelto. Can                            start  baby ASA. D/w family. Jackquline Denmark, MD 03/28/2018 11:50:10 AM This report has been signed electronically.

## 2018-03-28 NOTE — Patient Instructions (Signed)
Handouts given on polyps, diverticulosis and hemorrhoids and GERD protocol. Follow POST DILATION DIET today.  That's clear liquids for one hour then soft foods for the rest of today. Stay off Xarelto for good; not to resume. Increase omeprazole 20mg  to twice daily. Start baby aspirin (81mg ) in 3 days.   YOU HAD AN ENDOSCOPIC PROCEDURE TODAY AT [facility]:   Refer to the procedure report that was given to you for any specific questions about what was found during the examination.  If the procedure report does not answer your questions, please call your gastroenterologist to clarify.  If you requested that your care partner not be given the details of your procedure findings, then the procedure report has been included in a sealed envelope for you to review at your convenience later.  YOU SHOULD EXPECT: Some feelings of bloating in the abdomen. Passage of more gas than usual.  Walking can help get rid of the air that was put into your GI tract during the procedure and reduce the bloating. If you had a lower endoscopy (such as a colonoscopy or flexible sigmoidoscopy) you may notice spotting of blood in your stool or on the toilet paper. If you underwent a bowel prep for your procedure, you may not have a normal bowel movement for a few days.  Please Note:  You might notice some irritation and congestion in your nose or some drainage.  This is from the oxygen used during your procedure.  There is no need for concern and it should clear up in a day or so.  SYMPTOMS TO REPORT IMMEDIATELY:   Following lower endoscopy (colonoscopy or flexible sigmoidoscopy):  Excessive amounts of blood in the stool  Significant tenderness or worsening of abdominal pains  Swelling of the abdomen that is new, acute  Fever of 100F or higher   Following upper endoscopy (EGD)  Vomiting of blood or coffee ground material  New chest pain or pain under the shoulder blades  Painful or persistently  difficult swallowing  New shortness of breath  Fever of 100F or higher  Black, tarry-looking stools  For urgent or emergent issues, a gastroenterologist can be reached at any hour by calling [phone].   DIET:  We do recommend a small meal at first, but then you may proceed to your regular diet.  Drink plenty of fluids but you should avoid alcoholic beverages for 24 hours.  ACTIVITY:  You should plan to take it easy for the rest of today and you should NOT DRIVE or use heavy machinery until tomorrow (because of the sedation medicines used during the test).    FOLLOW UP: Our staff will call the number listed on your records the next business day following your procedure to check on you and address any questions or concerns that you may have regarding the information given to you following your procedure. If we do not reach you, we will leave a message.  However, if you are feeling well and you are not experiencing any problems, there is no need to return our call.  We will assume that you have returned to your regular daily activities without incident.  If any biopsies were taken you will be contacted by phone or by letter within the next 1-3 weeks.  Please call us at [phone] if you have not heard about the biopsies in 3 weeks.    SIGNATURES/CONFIDENTIALITY: You and/or your care partner have signed paperwork which will be entered into your electronic  medical record.  These signatures attest to the fact that that the information above on your After Visit Summary has been reviewed and is understood.  Full responsibility of the confidentiality of this discharge information lies with you and/or your care-partner.

## 2018-03-29 ENCOUNTER — Telehealth: Payer: Self-pay

## 2018-03-29 NOTE — Telephone Encounter (Signed)
Called and spoke with patient- patient scheduled for 4 week f/u post upper gi endo/colonoscopy; Patient was advised to call back if questions/concerns arise; Patient verbalized understanding of information/instructions;

## 2018-03-29 NOTE — Telephone Encounter (Signed)
  Follow up Call-  Call back number 03/28/2018  Post procedure Call Back phone  # [phone]  Permission to leave phone message Yes     Patient questions:  Do you have a fever, pain , or abdominal swelling? No. Pain Score  0 *  Have you tolerated food without any problems? Yes.    Have you been able to return to your normal activities? Yes.    Do you have any questions about your discharge instructions: Diet   No. Medications  No. Follow up visit  No.  Do you have questions or concerns about your Care? No.  Actions: * If pain score is 4 or above: No action needed, pain <4.

## 2018-04-08 DIAGNOSIS — Z79899 Other long term (current) drug therapy: Secondary | ICD-10-CM | POA: Diagnosis not present

## 2018-04-08 DIAGNOSIS — N183 Chronic kidney disease, stage 3 (moderate): Secondary | ICD-10-CM | POA: Diagnosis not present

## 2018-04-08 DIAGNOSIS — K2951 Unspecified chronic gastritis with bleeding: Secondary | ICD-10-CM | POA: Diagnosis not present

## 2018-04-08 DIAGNOSIS — K317 Polyp of stomach and duodenum: Secondary | ICD-10-CM | POA: Diagnosis not present

## 2018-04-08 DIAGNOSIS — K5520 Angiodysplasia of colon without hemorrhage: Secondary | ICD-10-CM | POA: Diagnosis not present

## 2018-04-08 DIAGNOSIS — I5042 Chronic combined systolic (congestive) and diastolic (congestive) heart failure: Secondary | ICD-10-CM | POA: Diagnosis not present

## 2018-04-08 DIAGNOSIS — D509 Iron deficiency anemia, unspecified: Secondary | ICD-10-CM | POA: Diagnosis not present

## 2018-05-02 ENCOUNTER — Telehealth: Payer: Self-pay | Admitting: Gastroenterology

## 2018-05-02 NOTE — Telephone Encounter (Signed)
The appt has been cancelled.  I will forward to Dr Lyndel Safe as Juluis Rainier

## 2018-05-02 NOTE — Telephone Encounter (Signed)
Pt's daughter [name] called to clx pt's f/u appt with Dr. Lyndel Safe that was scheduled on 05/05/18. She stated that she spoke with Dr. Lyndel Safe and agreed on cancelling appt. She wanted to let him know that pt will be seeing PCP on 6/25 and will have labs that Dr. Lyndel Safe wanted.

## 2018-05-05 ENCOUNTER — Ambulatory Visit: Payer: Medicare Other | Admitting: Gastroenterology

## 2018-05-17 DIAGNOSIS — E039 Hypothyroidism, unspecified: Secondary | ICD-10-CM | POA: Diagnosis not present

## 2018-05-17 DIAGNOSIS — I25118 Atherosclerotic heart disease of native coronary artery with other forms of angina pectoris: Secondary | ICD-10-CM | POA: Diagnosis not present

## 2018-05-17 DIAGNOSIS — K5520 Angiodysplasia of colon without hemorrhage: Secondary | ICD-10-CM | POA: Insufficient documentation

## 2018-05-17 DIAGNOSIS — I5042 Chronic combined systolic (congestive) and diastolic (congestive) heart failure: Secondary | ICD-10-CM | POA: Diagnosis not present

## 2018-05-17 DIAGNOSIS — I4819 Other persistent atrial fibrillation: Secondary | ICD-10-CM | POA: Diagnosis not present

## 2018-05-17 DIAGNOSIS — K317 Polyp of stomach and duodenum: Secondary | ICD-10-CM | POA: Diagnosis not present

## 2018-05-26 DIAGNOSIS — R509 Fever, unspecified: Secondary | ICD-10-CM | POA: Diagnosis not present

## 2018-05-26 DIAGNOSIS — Z20828 Contact with and (suspected) exposure to other viral communicable diseases: Secondary | ICD-10-CM | POA: Diagnosis not present

## 2018-05-26 DIAGNOSIS — N3001 Acute cystitis with hematuria: Secondary | ICD-10-CM | POA: Diagnosis not present

## 2018-05-30 DIAGNOSIS — E782 Mixed hyperlipidemia: Secondary | ICD-10-CM | POA: Diagnosis not present

## 2018-05-30 DIAGNOSIS — I480 Paroxysmal atrial fibrillation: Secondary | ICD-10-CM | POA: Diagnosis not present

## 2018-05-30 DIAGNOSIS — I10 Essential (primary) hypertension: Secondary | ICD-10-CM | POA: Diagnosis not present

## 2018-05-30 DIAGNOSIS — I4819 Other persistent atrial fibrillation: Secondary | ICD-10-CM | POA: Diagnosis not present

## 2018-05-30 DIAGNOSIS — I4892 Unspecified atrial flutter: Secondary | ICD-10-CM | POA: Diagnosis not present

## 2018-06-02 ENCOUNTER — Ambulatory Visit: Payer: Medicare Other | Admitting: Sports Medicine

## 2018-06-22 ENCOUNTER — Other Ambulatory Visit: Payer: Self-pay

## 2018-06-22 ENCOUNTER — Ambulatory Visit (INDEPENDENT_AMBULATORY_CARE_PROVIDER_SITE_OTHER): Payer: Medicare Other | Admitting: Sports Medicine

## 2018-06-22 ENCOUNTER — Encounter: Payer: Self-pay | Admitting: Sports Medicine

## 2018-06-22 VITALS — BP 149/77 | HR 85 | Temp 96.8°F | Resp 16

## 2018-06-22 DIAGNOSIS — M79671 Pain in right foot: Secondary | ICD-10-CM | POA: Diagnosis not present

## 2018-06-22 DIAGNOSIS — I739 Peripheral vascular disease, unspecified: Secondary | ICD-10-CM | POA: Diagnosis not present

## 2018-06-22 DIAGNOSIS — B351 Tinea unguium: Secondary | ICD-10-CM

## 2018-06-22 DIAGNOSIS — M79672 Pain in left foot: Secondary | ICD-10-CM

## 2018-06-22 DIAGNOSIS — L84 Corns and callosities: Secondary | ICD-10-CM

## 2018-06-22 DIAGNOSIS — Z7901 Long term (current) use of anticoagulants: Secondary | ICD-10-CM

## 2018-06-22 NOTE — Progress Notes (Signed)
Subjective: Deborah Ball is a 85 y.o. female patient seen today in office with complaint of mildly painful thickened and elongated toenails and  mildly painful corn on bottom of right foot; unable to trim. Patient is assisted by daughter who was in the car.  Patient admits that she is going for surgery on her neck to have a mass excised that is noncancerous.    Patient denies any changes with medication or any other complaints at this time.  There are no active problems to display for this patient.    Current Outpatient Medications:  .  amiodarone (PACERONE) 200 MG tablet, Take 200 mg by mouth daily., Disp: , Rfl:  .  aspirin EC 81 MG tablet, Take by mouth., Disp: , Rfl:  .  Cholecalciferol (VITAMIN D3 PO), Take 1 tablet by mouth daily., Disp: , Rfl:  .  Denosumab (PROLIA [place]), Inject into the skin every 6 (six) months., Disp: , Rfl:  .  diltiazem (CARTIA XT) 240 MG 24 hr capsule, Take 240 mg by mouth daily. , Disp: , Rfl:  .  furosemide (LASIX) 20 MG tablet, Take 20 mg by mouth as needed. , Disp: , Rfl:  .  levothyroxine (SYNTHROID, LEVOTHROID) 50 MCG tablet, Take 50 mcg by mouth daily before breakfast. , Disp: , Rfl:  .  mupirocin ointment (BACTROBAN) 2 %, Place 1 application into the nose as needed (for skin wounds). , Disp: , Rfl:  .  potassium chloride (K-DUR,KLOR-CON) 10 MEQ tablet, Take 20 mEq by mouth as needed. , Disp: , Rfl:  .  potassium chloride (KLOR-CON) 20 MEQ packet, Take by mouth., Disp: , Rfl:  .  Propylene Glycol (SYSTANE BALANCE) 0.6 % SOLN, Place 1 drop into both eyes as needed (for dry eyes). , Disp: , Rfl:  .  trimethoprim-polymyxin b (POLYTRIM) ophthalmic solution, INSTILL 1 GTT IN OU QID FOR 10 DAYS, Disp: , Rfl:   Current Facility-Administered Medications:  .  0.9 %  sodium chloride infusion, 500 mL, Intravenous, Once, Gupta, Rajesh, MD  Allergies  Allergen Reactions  . Other Anaphylaxis and Other (See Comments)    Phenbarbitol-unknown reaction, but pt had been  told allergic Contrast Dye   . Statins Other (See Comments)    Other reaction(s): OTHER Joint pain  Other reaction(s): OTHER   . Sulfur Rash  . Clarithromycin Other (See Comments)    Joint pain   . Iodinated Diagnostic Agents Other (See Comments)    Tremors and shaking Other reaction(s): OTHER Shaking   . Morphine Other (See Comments)    Other reaction(s): Other (See Comments) AMS, Agitation   . Aspirin     Bruising per patient  . Ciprofloxacin     Other reaction(s): OTHER  . Food     NUTS AND SEEDS == DUE TO diverticulitis  DOES NOT EAT CHOCOLATE OR TAKE CAFFEINE PRODUCTS  . Penicillins   . Phenobarbital     Other reaction(s): UNKNOWN  . Sulfa Antibiotics     Other reaction(s): RASH Other reaction(s): RASH    Objective: Physical Exam  General: Well developed, nourished, no acute distress, awake, alert and oriented x 3  Vascular: Dorsalis pedis artery 1/4 bilateral, Posterior tibial artery 0/4 bilateral due to trace edema at ankles, skin temperature warm to warm proximal to distal bilateral lower extremities, + varicosities, scant pedal hair present bilateral.  Neurological: Gross sensation present via light touch bilateral.   Dermatological: Skin is warm, dry, and supple bilateral, Nails 1-10 are tender, long, thick, and discolored   with mild subungal debris, no webspace macerations present bilateral, no open lesions present bilateral, + hyperkeratotic tissue present right sub met 1.  No signs of infection bilateral.  Musculoskeletal: Mild pain to callus area on right foot. Asymptomatic hammertoe boney deformities noted bilateral. Muscular strength within normal limits without pain on range of motion. No pain with calf compression bilateral.  Assessment and Plan:  Problem List Items Addressed This Visit    None    Visit Diagnoses    Dermatophytosis of nail    -  Primary   Corns and callosities       Foot pain, bilateral       PVD (peripheral vascular disease)  (HCC)       Relevant Medications   aspirin EC 81 MG tablet   Current use of long term anticoagulation          -Examined patient.  -Discussed treatment options for painful mycotic nails and callus. -Mechanically debrided callus x 1 using sterile chisel blade at no additional charge and reduced mycotic nails with sterile nail nipper and dremel nail file without incident. -Continue with good supportive shoes daily for foot type with padding at the ball to slow callus recurrence -Patient to return in 3 months for follow up evaluation or sooner if symptoms worsen.  Titorya Stover, DPM  

## 2018-06-28 ENCOUNTER — Telehealth: Payer: Self-pay | Admitting: Gastroenterology

## 2018-06-28 NOTE — Telephone Encounter (Signed)
Pt daughter called and stated that she was advised by dr.gupta that he will be referring her to dr.mansouraty to remove a polop from her mother. However she stated she has not heard anything and was just checking up.

## 2018-06-28 NOTE — Telephone Encounter (Signed)
Dr Lyndel Safe I see no mention of you speaking with Dr Rush Landmark after the March procedures.  Please advise

## 2018-06-29 NOTE — Telephone Encounter (Signed)
I did show him the pictures and he advised EUS, followed by polypectomy (if EUS is neg) (routine procedure once Covid-19 is over). So, anytime after June 15. I know he has several waiting. So, routine will be OK. Also, check CBC before. I will talk to her daughter

## 2018-06-30 NOTE — Telephone Encounter (Signed)
Dr Rush Landmark July 1 is your first available for a 2 hour spot is this ok?

## 2018-06-30 NOTE — Telephone Encounter (Signed)
Yes that is fine. Thank you. GM

## 2018-06-30 NOTE — Telephone Encounter (Signed)
Dr Mansouraty please advise  

## 2018-06-30 NOTE — Telephone Encounter (Signed)
Merrie Roof,  Thank you for bringing this to my attention. I do remember this case and you showing me the pictures. Let's plan an EGD/EUS/EMR 2-hour slot. I would like an opportunity to talk with the patient and daughter via telehealth a week or more before, so a telehealth visit would be great. Thanks. GM

## 2018-07-01 ENCOUNTER — Other Ambulatory Visit: Payer: Self-pay

## 2018-07-01 DIAGNOSIS — K317 Polyp of stomach and duodenum: Secondary | ICD-10-CM

## 2018-07-01 NOTE — Telephone Encounter (Signed)
EUS scheduled, pt instructed and medications reviewed.  Patient instructions mailed to home.  Patient to call with any questions or concerns. The pt has been advised of the information and verbalized understanding of the COVID testing and instructions.

## 2018-07-20 DIAGNOSIS — I4892 Unspecified atrial flutter: Secondary | ICD-10-CM | POA: Diagnosis not present

## 2018-07-20 DIAGNOSIS — I4819 Other persistent atrial fibrillation: Secondary | ICD-10-CM | POA: Diagnosis not present

## 2018-07-20 DIAGNOSIS — K922 Gastrointestinal hemorrhage, unspecified: Secondary | ICD-10-CM | POA: Diagnosis not present

## 2018-07-20 DIAGNOSIS — K7460 Unspecified cirrhosis of liver: Secondary | ICD-10-CM | POA: Diagnosis not present

## 2018-07-20 DIAGNOSIS — I10 Essential (primary) hypertension: Secondary | ICD-10-CM | POA: Diagnosis not present

## 2018-07-21 DIAGNOSIS — R7303 Prediabetes: Secondary | ICD-10-CM | POA: Diagnosis not present

## 2018-07-21 DIAGNOSIS — E039 Hypothyroidism, unspecified: Secondary | ICD-10-CM | POA: Diagnosis not present

## 2018-07-21 DIAGNOSIS — D509 Iron deficiency anemia, unspecified: Secondary | ICD-10-CM | POA: Diagnosis not present

## 2018-07-21 DIAGNOSIS — E559 Vitamin D deficiency, unspecified: Secondary | ICD-10-CM | POA: Diagnosis not present

## 2018-07-21 DIAGNOSIS — K7469 Other cirrhosis of liver: Secondary | ICD-10-CM | POA: Diagnosis not present

## 2018-07-21 DIAGNOSIS — I130 Hypertensive heart and chronic kidney disease with heart failure and stage 1 through stage 4 chronic kidney disease, or unspecified chronic kidney disease: Secondary | ICD-10-CM | POA: Diagnosis not present

## 2018-07-21 DIAGNOSIS — I25118 Atherosclerotic heart disease of native coronary artery with other forms of angina pectoris: Secondary | ICD-10-CM | POA: Diagnosis not present

## 2018-07-21 DIAGNOSIS — K5520 Angiodysplasia of colon without hemorrhage: Secondary | ICD-10-CM | POA: Diagnosis not present

## 2018-07-21 DIAGNOSIS — I5042 Chronic combined systolic (congestive) and diastolic (congestive) heart failure: Secondary | ICD-10-CM | POA: Diagnosis not present

## 2018-07-21 DIAGNOSIS — N183 Chronic kidney disease, stage 3 (moderate): Secondary | ICD-10-CM | POA: Diagnosis not present

## 2018-07-21 DIAGNOSIS — M8949 Other hypertrophic osteoarthropathy, multiple sites: Secondary | ICD-10-CM | POA: Diagnosis not present

## 2018-07-21 DIAGNOSIS — I4819 Other persistent atrial fibrillation: Secondary | ICD-10-CM | POA: Diagnosis not present

## 2018-07-21 DIAGNOSIS — Z9181 History of falling: Secondary | ICD-10-CM | POA: Diagnosis not present

## 2018-07-22 ENCOUNTER — Ambulatory Visit (INDEPENDENT_AMBULATORY_CARE_PROVIDER_SITE_OTHER): Payer: Medicare Other | Admitting: Gastroenterology

## 2018-07-22 ENCOUNTER — Other Ambulatory Visit (HOSPITAL_COMMUNITY): Payer: Medicare Other

## 2018-07-22 DIAGNOSIS — D131 Benign neoplasm of stomach: Secondary | ICD-10-CM

## 2018-07-22 DIAGNOSIS — K317 Polyp of stomach and duodenum: Secondary | ICD-10-CM | POA: Diagnosis not present

## 2018-07-22 DIAGNOSIS — D509 Iron deficiency anemia, unspecified: Secondary | ICD-10-CM

## 2018-07-22 NOTE — Progress Notes (Signed)
Deborah Ball's Covid 48 screen has been changed to 07/23/18.

## 2018-07-22 NOTE — Progress Notes (Signed)
[facility] VISIT   Primary Care Provider Raina Mina., MD [place] [address] [phone]  Referring Provider Raina Mina., MD [place],  [place] [address] [phone]  Patient Profile: Deborah Ball is a 85 y.o. female with a pmh significant for Afib (previously on Xarelto now ASA), Cryptogenic Cirrhosis (with known EVs), heterozygote for HFE gene, colon polyps, Schatzki Ring, GERD, hx of Breast Cancer, HTN, HLD, Inflammatory Polyp of the GE junction.  The patient presents to the [facility] for an evaluation and management of problem(s) noted below:  Problem List 1. Gastric polyp   2. Iron deficiency anemia, unspecified iron deficiency anemia type   3. Inflammatory fibroid polyps of stomach     I connected with  Deborah Ball on 07/23/18. I verified that I was speaking with the correct person using two identifiers. Due to the COVID-19 Pandemic, this service was provided via telemedicine using audio/visual media. The patient was located at home. The provider was located in the office. The patient did consent to this visit and is aware of charges through their insurance as well as the limitations of evaluation and management by telemedicine. The patient was referred by Dr. Lyndel Safe. Other persons participating in this telemedicine service were the patient's daughter. Time spent on visit was greater than 30 minutes.   History of Present Illness This is a patient that has been referred by Dr. Lyndel Safe for consideration of advanced polyp resection.  The patient was found on an upper endoscopy being done for iron deficiency anemia evaluation to have an inflammatory polyp in the region of the gastroesophageal junction.  She has underlying esophageal varices as well.  Patient also has a hiatal hernia on her exam and it looks like this polyp is within the hiatal hernia.  Overall, since the  patient has been off Xarelto and just on aspirin her most recent laboratories as outlined below have shown improvement in her iron deficiency as well as her anemia.  Patient was recently seen by her cardiologist and would like to be reinitiated back on Xarelto if possible because they feel that her stroke risk is high and is not clear that she is gaining benefit per their report on just aspirin 81 mg.  She denies any solid food or liquid dysphagia.  No odynophagia.  The family is most interested in trying to get the patient back on Xarelto if possible.  Stools are darker because of iron.  Denies any hematochezia.  GI Review of Systems Positive as above Negative for pyrosis, change in appetite, weight loss, change in bowel habit  Review of Systems General: Denies fevers/chills HEENT: Denies oral lesions Cardiovascular: Denies chest pain Pulmonary: Denies shortness of breath Gastroenterological: See HPI Genitourinary: Denies darkened urine or hematuria Hematological: Positive for history of easy bruising/bleeding Dermatological: Denies jaundice Psychological: Mood is stable   Medications Current Outpatient Medications  Medication Sig Dispense Refill   amiodarone (PACERONE) 200 MG tablet Take 200 mg by mouth daily.     aspirin EC 81 MG tablet Take 81 mg by mouth daily.      cholecalciferol (VITAMIN D) 25 MCG (1000 UT) tablet Take 1,000 Units by mouth 2 (two) times a day.     Denosumab (PROLIA [place]) Inject 1 Dose into the skin every 6 (six) months.      diltiazem (CARTIA XT) 300 MG 24 hr capsule Take 300 mg by mouth daily.     ferrous sulfate  325 (65 FE) MG tablet Take 325 mg by mouth every Monday, Wednesday, and Friday. At lunch     furosemide (LASIX) 20 MG tablet Take 20 mg by mouth daily as needed (for swelling or high sodium intake--with potassium).      levothyroxine (SYNTHROID, LEVOTHROID) 50 MCG tablet Take 50 mcg by mouth daily before breakfast.      mupirocin ointment  (BACTROBAN) 2 % Place 1 application into the nose as needed (for skin wounds).      omeprazole (PRILOSEC) 20 MG capsule Take 20 mg by mouth 2 (two) times a day.     potassium chloride (K-DUR,KLOR-CON) 10 MEQ tablet Take 10 mEq by mouth daily as needed (for swelling or high sodium intake---with lasix).      Propylene Glycol (SYSTANE BALANCE) 0.6 % SOLN Place 1 drop into both eyes as needed (for dry eyes).      Current Facility-Administered Medications  Medication Dose Route Frequency Provider Last Rate Last Dose   0.9 %  sodium chloride infusion  500 mL Intravenous Once Jackquline Denmark, MD        Allergies Allergies  Allergen Reactions   Other Anaphylaxis and Other (See Comments)    Phenbarbitol-unknown reaction, but pt had been told allergic Contrast Dye    Statins Other (See Comments)    Joint pain.    Sulfur Rash   Clarithromycin Other (See Comments)    Joint pain    Iodinated Diagnostic Agents Other (See Comments)    Tremors and shaking   Morphine Other (See Comments)    AMS, Agitation     Aspirin     Bruising per patient   Ciprofloxacin Other (See Comments)    joints   Food     NUTS AND SEEDS == DUE TO diverticulitis  DOES NOT EAT CHOCOLATE OR TAKE CAFFEINE PRODUCTS   Penicillins     Did it involve swelling of the face/tongue/throat, SOB, or low BP? Unknown Did it involve sudden or severe rash/hives, skin peeling, or any reaction on the inside of your mouth or nose? Unknown Did you need to seek medical attention at a hospital or doctor's office? Unknown When did it last happen?unable to clarify If all above answers are NO, may proceed with cephalosporin use.    Phenobarbital     Other reaction(s): UNKNOWN   Sulfa Antibiotics Rash    Histories Past Medical History:  Diagnosis Date   A-fib [facility])    Arrhythmia    Arthritis    Cancer ([place])    hx R breast cancer, R eye cancer-squamous cell 2018   Chronic liver disease    Cirrhosis of  liver (HCC)    Mild per patient's daughter   Congestive heart failure (HCC)    Diverticulitis    Diverticulosis    Esophageal stricture    said she had her esophageal stretched once    Gallstones    GERD (gastroesophageal reflux disease)    Hemochromatosis    History of blood clots    left leg, superficial thrombosis 03/21/2018 wasn't giving anything. was told how to care for it    History of colon polyps    Hyperlipidemia    Hypertension    IBS (irritable bowel syndrome)    Kidney stones    Thyroid disease    Past Surgical History:  Procedure Laterality Date   ABDOMINAL HYSTERECTOMY     ABDOMINAL SURGERY     APPENDECTOMY     BLADDER SURGERY     x2  bladder tack   BREAST LUMPECTOMY Bilateral    2 times    CARDIOVERSION     x2 2017/2018   CHOLECYSTECTOMY     COLONOSCOPY     around 2005-2006 age 39    ESOPHAGOGASTRODUODENOSCOPY     around early 2000's    [place]     rod in place on right side    Social History   Socioeconomic History   Marital status: Widowed    Spouse name: Not on file   Number of children: 3   Years of education: Not on file   Highest education level: Not on file  Occupational History   Not on file  Social Needs   Financial resource strain: Not on file   Food insecurity    Worry: Not on file    Inability: Not on file   Transportation needs    Medical: Not on file    Non-medical: Not on file  Tobacco Use   Smoking status: Never Smoker   Smokeless tobacco: Never Used  Substance and Sexual Activity   Alcohol use: Not Currently   Drug use: Never   Sexual activity: Not on file  Lifestyle   Physical activity    Days per week: Not on file    Minutes per session: Not on file   Stress: Not on file  Relationships   Social connections    Talks on phone: Not on file    Gets together: Not on file    Attends religious service: Not on file    Active member of club or organization:  Not on file    Attends meetings of clubs or organizations: Not on file    Relationship status: Not on file   Intimate partner violence    Fear of current or ex partner: Not on file    Emotionally abused: Not on file    Physically abused: Not on file    Forced sexual activity: Not on file  Other Topics Concern   Not on file  Social History Narrative   Not on file   Family History  Problem Relation Age of Onset   Colon cancer Child    Skin cancer Child    Other Child        liver transplant    Colon polyps Child    Liver disease Child    Heart disease Father    Prostate cancer Brother    Skin cancer Daughter    Colon polyps Daughter    Liver disease Maternal Aunt        died from liver failure per patient's daughter   Other Cousin        liver transplant. On mom's side    Colon polyps Daughter    Esophageal cancer Neg Hx    Inflammatory bowel disease Neg Hx    Pancreatic cancer Neg Hx    Stomach cancer Neg Hx    I have reviewed her medical, social, and family history in detail and updated the electronic medical record as necessary.    PHYSICAL EXAMINATION  Telehealth visit   REVIEW OF DATA  I reviewed the following data at the time of this encounter:  GI Procedures and Studies  March 2020 EGD - Very minimal esophageal varices. - Mild Schatzki ring. Dilated. - Small hiatal hernia. - A single gastric polyp with superficial ulcerations (likely etiology of iron deficiency anemia). Biopsied.  March 2020 Colonoscopy -Small colonic polyps status post polypectomy. -  4 non-bleeding colonic angiodysplastic lesions. -Pancolonic diverticulosis predominantly in the sigmoid colon. -Mod internal hemorrhoids. -Otherwise normal colonoscopy to TI.  Pathology Diagnosis 1. Surgical [P], gastric polyp - ULCERATED GASTRIC HYPERPLASTIC POLYP - NEGATIVE FOR INTESTINAL METAPLASIA OR DYSPLASIA - WARTHIN STARRY STAIN IS NEGATIVE FOR HELICOBACTER PYLORI 2.  Surgical [P], colon, cecum, ascending, polyp (4) - TUBULAR ADENOMA(S) - NEGATIVE FOR HIGH-GRADE DYSPLASIA OR MALIGNANCY  Laboratory Studies  Reviewed in Epic and CareEverywhere  Imaging Studies  2017 CTAP IMPRESSION: 1. Acute diverticulitis of the sigmoid colon. No evidence of perforation or abscess formation. 2. Multiple low-attenuation liver lesions, favored to represent hepatic cysts, but incompletely evaluated in the absence of IV contrast. 3. **An incidental finding of potential clinical significance has been found. Complex cystic focus of the left adnexa. In a patient of this age, further characterization with pelvic ultrasound is recommended. This follows the recommendations of the white paper Managing Incidental Findings on Abdominal and Pelvic CT and MRI: Part 1: White Paper of the ACR Incidental Findings Committee II on Adnexal Findings. Posey Pronto MD et al. Bernette Redbird 2013 Sept;10(9):675-681.**   ASSESSMENT  Ms. Mckneely is a 85 y.o. female  with a pmh significant for Afib (previously on Xarelto now ASA), Cryptogenic Cirrhosis (with known EVs), heterozygote for HFE gene, colon polyps, Schatzki Ring, GERD, hx of Breast Cancer, HTN, HLD, Inflammatory Polyp of the GE junction.  The patient is seen today for evaluation and management of:  1. Gastric polyp   2. Iron deficiency anemia, unspecified iron deficiency anemia type   3. Inflammatory fibroid polyps of stomach    The patient seems to be hemodynamically and clinically stable.  Thankfully, her hemoglobin and iron indices have improved based on her most recent laboratories found in care everywhere.  This is good news.  We had a long discussion and review of the findings on her endoscopy and colonoscopy.  The patient's cardiologist would like the patient to try and return to anticoagulation if possible to decrease her risk of stroke.  I discussed in great detail the findings on her colonoscopy which also showed multiple cecal AVMs.  I also  described a history of angiectasia's such that they can be found in any portion of the GI tract including small bowel which is not been interrogated as of yet.  If the patient were to be reinitiated on Xarelto even if we are able to remove her inflammatory polyp from the gastric region is entirely possible that she may develop or redeveloped iron deficiency.  I want to make sure that they understood this as well.  This would be as a result of potentially cecal angiectasia's not actively using (plan had been to not go after these will treat these unless there was active bleeding or recurrent anemia found).  The patient daughter also had multiple questions in regards to underlying cirrhosis history of her mother as well as her family history of cirrhosis.  We discussed this briefly but have held those discussions to her primary gastroenterologist Dr. Lyndel Safe to further discuss with them in the future.  Based upon the description and endoscopic pictures I do feel that it is reasonable to pursue an Advanced Polypectomy attempt of the polyp/lesion.  We discussed some of the techniques of advanced polypectomy which include Endoscopic Mucosal Resection and Tissue Ablation via Fulguration.  The risks and benefits of endoscopic evaluation were discussed with the patient; these include but are not limited to the risk of perforation, infection, bleeding, missed lesions, lack of diagnosis, severe  illness requiring hospitalization, as well as anesthesia and sedation related illnesses.  During attempts at advanced polypectomy, the risks of bleeding and perforation/leak are increased as opposed to diagnostic and screening endoscopies, and that was discussed with the patient as well.   In addition, I explained that with the possible need for piecemeal resection, subsequent short-interval endoscopic evaluation for follow up and potential retreatment of the lesion/area may be necessary.  If, after attempt at removal of the polyp, it is  found that the patient has a complication or that an invasive lesion or malignant lesion is found, or that the polyp continues to recur, the patient is aware and understands that surgery may still be indicated/required.  I plan to perform an endoscopic ultrasound at time of procedure to try and decrease her risk of bleeding and ensure that varicose veins are not noted under the lesion.    The risks of EUS including bleeding, infection, aspiration pneumonia and intestinal perforation were discussed as was the possibility it may not give a definitive diagnosis.  All patient questions were answered, to the best of my ability, and the patient agrees to the aforementioned plan of action with follow-up as indicated.   PLAN  Patient to proceed to EGD with EUS and EMR attempt next week   No orders of the defined types were placed in this encounter.   New Prescriptions   No medications on file   Modified Medications   No medications on file    Planned Follow Up No follow-ups on file.   Justice Britain, MD [facility] Advanced Endoscopy Office # [phone]

## 2018-07-22 NOTE — Patient Instructions (Signed)
Thank you for choosing me and [facility].  Dr. Rush Landmark

## 2018-07-22 NOTE — H&P (View-Only) (Signed)
[place] VISIT   Primary Care Provider Raina Mina., MD [place] [address] [phone]  Referring Provider Raina Mina., MD [place],  [place] [address] [phone]  Patient Profile: Deborah Ball is a 85 y.o. female with a pmh significant for Afib (previously on Xarelto now ASA), Cryptogenic Cirrhosis (with known EVs), heterozygote for HFE gene, colon polyps, Schatzki Ring, GERD, hx of Breast Cancer, HTN, HLD, Inflammatory Polyp of the GE junction.  The patient presents to the [facility] for an evaluation and management of problem(s) noted below:  Problem List 1. Gastric polyp   2. Iron deficiency anemia, unspecified iron deficiency anemia type   3. Inflammatory fibroid polyps of stomach     I connected with  Buel Ream on 07/23/18. I verified that I was speaking with the correct person using two identifiers. Due to the COVID-19 Pandemic, this service was provided via telemedicine using audio/visual media. The patient was located at home. The provider was located in the office. The patient did consent to this visit and is aware of charges through their insurance as well as the limitations of evaluation and management by telemedicine. The patient was referred by Dr. Lyndel Safe. Other persons participating in this telemedicine service were the patient's daughter. Time spent on visit was greater than 30 minutes.   History of Present Illness This is a patient that has been referred by Dr. Lyndel Safe for consideration of advanced polyp resection.  The patient was found on an upper endoscopy being done for iron deficiency anemia evaluation to have an inflammatory polyp in the region of the gastroesophageal junction.  She has underlying esophageal varices as well.  Patient also has a hiatal hernia on her exam and it looks like this polyp is within the hiatal hernia.  Overall, since the  patient has been off Xarelto and just on aspirin her most recent laboratories as outlined below have shown improvement in her iron deficiency as well as her anemia.  Patient was recently seen by her cardiologist and would like to be reinitiated back on Xarelto if possible because they feel that her stroke risk is high and is not clear that she is gaining benefit per their report on just aspirin 81 mg.  She denies any solid food or liquid dysphagia.  No odynophagia.  The family is most interested in trying to get the patient back on Xarelto if possible.  Stools are darker because of iron.  Denies any hematochezia.  GI Review of Systems Positive as above Negative for pyrosis, change in appetite, weight loss, change in bowel habit  Review of Systems General: Denies fevers/chills HEENT: Denies oral lesions Cardiovascular: Denies chest pain Pulmonary: Denies shortness of breath Gastroenterological: See HPI Genitourinary: Denies darkened urine or hematuria Hematological: Positive for history of easy bruising/bleeding Dermatological: Denies jaundice Psychological: Mood is stable   Medications Current Outpatient Medications  Medication Sig Dispense Refill   amiodarone (PACERONE) 200 MG tablet Take 200 mg by mouth daily.     aspirin EC 81 MG tablet Take 81 mg by mouth daily.      cholecalciferol (VITAMIN D) 25 MCG (1000 UT) tablet Take 1,000 Units by mouth 2 (two) times a day.     Denosumab (PROLIA [place]) Inject 1 Dose into the skin every 6 (six) months.      diltiazem (CARTIA XT) 300 MG 24 hr capsule Take 300 mg by mouth daily.     ferrous sulfate  325 (65 FE) MG tablet Take 325 mg by mouth every Monday, Wednesday, and Friday. At lunch     furosemide (LASIX) 20 MG tablet Take 20 mg by mouth daily as needed (for swelling or high sodium intake--with potassium).      levothyroxine (SYNTHROID, LEVOTHROID) 50 MCG tablet Take 50 mcg by mouth daily before breakfast.      mupirocin ointment  (BACTROBAN) 2 % Place 1 application into the nose as needed (for skin wounds).      omeprazole (PRILOSEC) 20 MG capsule Take 20 mg by mouth 2 (two) times a day.     potassium chloride (K-DUR,KLOR-CON) 10 MEQ tablet Take 10 mEq by mouth daily as needed (for swelling or high sodium intake---with lasix).      Propylene Glycol (SYSTANE BALANCE) 0.6 % SOLN Place 1 drop into both eyes as needed (for dry eyes).      Current Facility-Administered Medications  Medication Dose Route Frequency Provider Last Rate Last Dose   0.9 %  sodium chloride infusion  500 mL Intravenous Once Jackquline Denmark, MD        Allergies Allergies  Allergen Reactions   Other Anaphylaxis and Other (See Comments)    Phenbarbitol-unknown reaction, but pt had been told allergic Contrast Dye    Statins Other (See Comments)    Joint pain.    Sulfur Rash   Clarithromycin Other (See Comments)    Joint pain    Iodinated Diagnostic Agents Other (See Comments)    Tremors and shaking   Morphine Other (See Comments)    AMS, Agitation     Aspirin     Bruising per patient   Ciprofloxacin Other (See Comments)    joints   Food     NUTS AND SEEDS == DUE TO diverticulitis  DOES NOT EAT CHOCOLATE OR TAKE CAFFEINE PRODUCTS   Penicillins     Did it involve swelling of the face/tongue/throat, SOB, or low BP? Unknown Did it involve sudden or severe rash/hives, skin peeling, or any reaction on the inside of your mouth or nose? Unknown Did you need to seek medical attention at a hospital or doctor's office? Unknown When did it last happen?unable to clarify If all above answers are NO, may proceed with cephalosporin use.    Phenobarbital     Other reaction(s): UNKNOWN   Sulfa Antibiotics Rash    Histories Past Medical History:  Diagnosis Date   A-fib [facility])    Arrhythmia    Arthritis    Cancer ([place])    hx R breast cancer, R eye cancer-squamous cell 2018   Chronic liver disease    Cirrhosis of  liver (HCC)    Mild per patient's daughter   Congestive heart failure (HCC)    Diverticulitis    Diverticulosis    Esophageal stricture    said she had her esophageal stretched once    Gallstones    GERD (gastroesophageal reflux disease)    Hemochromatosis    History of blood clots    left leg, superficial thrombosis 03/21/2018 wasn't giving anything. was told how to care for it    History of colon polyps    Hyperlipidemia    Hypertension    IBS (irritable bowel syndrome)    Kidney stones    Thyroid disease    Past Surgical History:  Procedure Laterality Date   ABDOMINAL HYSTERECTOMY     ABDOMINAL SURGERY     APPENDECTOMY     BLADDER SURGERY     x2  bladder tack   BREAST LUMPECTOMY Bilateral    2 times    CARDIOVERSION     x2 2017/2018   CHOLECYSTECTOMY     COLONOSCOPY     around 2005-2006 age 60    ESOPHAGOGASTRODUODENOSCOPY     around early 2000's    [place]     rod in place on right side    Social History   Socioeconomic History   Marital status: Widowed    Spouse name: Not on file   Number of children: 3   Years of education: Not on file   Highest education level: Not on file  Occupational History   Not on file  Social Needs   Financial resource strain: Not on file   Food insecurity    Worry: Not on file    Inability: Not on file   Transportation needs    Medical: Not on file    Non-medical: Not on file  Tobacco Use   Smoking status: Never Smoker   Smokeless tobacco: Never Used  Substance and Sexual Activity   Alcohol use: Not Currently   Drug use: Never   Sexual activity: Not on file  Lifestyle   Physical activity    Days per week: Not on file    Minutes per session: Not on file   Stress: Not on file  Relationships   Social connections    Talks on phone: Not on file    Gets together: Not on file    Attends religious service: Not on file    Active member of club or organization:  Not on file    Attends meetings of clubs or organizations: Not on file    Relationship status: Not on file   Intimate partner violence    Fear of current or ex partner: Not on file    Emotionally abused: Not on file    Physically abused: Not on file    Forced sexual activity: Not on file  Other Topics Concern   Not on file  Social History Narrative   Not on file   Family History  Problem Relation Age of Onset   Colon cancer Child    Skin cancer Child    Other Child        liver transplant    Colon polyps Child    Liver disease Child    Heart disease Father    Prostate cancer Brother    Skin cancer Daughter    Colon polyps Daughter    Liver disease Maternal Aunt        died from liver failure per patient's daughter   Other Cousin        liver transplant. On mom's side    Colon polyps Daughter    Esophageal cancer Neg Hx    Inflammatory bowel disease Neg Hx    Pancreatic cancer Neg Hx    Stomach cancer Neg Hx    I have reviewed her medical, social, and family history in detail and updated the electronic medical record as necessary.    PHYSICAL EXAMINATION  Telehealth visit   REVIEW OF DATA  I reviewed the following data at the time of this encounter:  GI Procedures and Studies  March 2020 EGD - Very minimal esophageal varices. - Mild Schatzki ring. Dilated. - Small hiatal hernia. - A single gastric polyp with superficial ulcerations (likely etiology of iron deficiency anemia). Biopsied.  March 2020 Colonoscopy -Small colonic polyps status post polypectomy. -  4 non-bleeding colonic angiodysplastic lesions. -Pancolonic diverticulosis predominantly in the sigmoid colon. -Mod internal hemorrhoids. -Otherwise normal colonoscopy to TI.  Pathology Diagnosis 1. Surgical [P], gastric polyp - ULCERATED GASTRIC HYPERPLASTIC POLYP - NEGATIVE FOR INTESTINAL METAPLASIA OR DYSPLASIA - WARTHIN STARRY STAIN IS NEGATIVE FOR HELICOBACTER PYLORI 2.  Surgical [P], colon, cecum, ascending, polyp (4) - TUBULAR ADENOMA(S) - NEGATIVE FOR HIGH-GRADE DYSPLASIA OR MALIGNANCY  Laboratory Studies  Reviewed in Epic and CareEverywhere  Imaging Studies  2017 CTAP IMPRESSION: 1. Acute diverticulitis of the sigmoid colon. No evidence of perforation or abscess formation. 2. Multiple low-attenuation liver lesions, favored to represent hepatic cysts, but incompletely evaluated in the absence of IV contrast. 3. **An incidental finding of potential clinical significance has been found. Complex cystic focus of the left adnexa. In a patient of this age, further characterization with pelvic ultrasound is recommended. This follows the recommendations of the white paper Managing Incidental Findings on Abdominal and Pelvic CT and MRI: Part 1: White Paper of the ACR Incidental Findings Committee II on Adnexal Findings. Posey Pronto MD et al. Bernette Redbird 2013 Sept;10(9):675-681.**   ASSESSMENT  Deborah Ball is a 85 y.o. female  with a pmh significant for Afib (previously on Xarelto now ASA), Cryptogenic Cirrhosis (with known EVs), heterozygote for HFE gene, colon polyps, Schatzki Ring, GERD, hx of Breast Cancer, HTN, HLD, Inflammatory Polyp of the GE junction.  The patient is seen today for evaluation and management of:  1. Gastric polyp   2. Iron deficiency anemia, unspecified iron deficiency anemia type   3. Inflammatory fibroid polyps of stomach    The patient seems to be hemodynamically and clinically stable.  Thankfully, her hemoglobin and iron indices have improved based on her most recent laboratories found in care everywhere.  This is good news.  We had a long discussion and review of the findings on her endoscopy and colonoscopy.  The patient's cardiologist would like the patient to try and return to anticoagulation if possible to decrease her risk of stroke.  I discussed in great detail the findings on her colonoscopy which also showed multiple cecal AVMs.  I also  described a history of angiectasia's such that they can be found in any portion of the GI tract including small bowel which is not been interrogated as of yet.  If the patient were to be reinitiated on Xarelto even if we are able to remove her inflammatory polyp from the gastric region is entirely possible that she may develop or redeveloped iron deficiency.  I want to make sure that they understood this as well.  This would be as a result of potentially cecal angiectasia's not actively using (plan had been to not go after these will treat these unless there was active bleeding or recurrent anemia found).  The patient daughter also had multiple questions in regards to underlying cirrhosis history of her mother as well as her family history of cirrhosis.  We discussed this briefly but have held those discussions to her primary gastroenterologist Dr. Lyndel Safe to further discuss with them in the future.  Based upon the description and endoscopic pictures I do feel that it is reasonable to pursue an Advanced Polypectomy attempt of the polyp/lesion.  We discussed some of the techniques of advanced polypectomy which include Endoscopic Mucosal Resection and Tissue Ablation via Fulguration.  The risks and benefits of endoscopic evaluation were discussed with the patient; these include but are not limited to the risk of perforation, infection, bleeding, missed lesions, lack of diagnosis, severe  illness requiring hospitalization, as well as anesthesia and sedation related illnesses.  During attempts at advanced polypectomy, the risks of bleeding and perforation/leak are increased as opposed to diagnostic and screening endoscopies, and that was discussed with the patient as well.   In addition, I explained that with the possible need for piecemeal resection, subsequent short-interval endoscopic evaluation for follow up and potential retreatment of the lesion/area may be necessary.  If, after attempt at removal of the polyp, it is  found that the patient has a complication or that an invasive lesion or malignant lesion is found, or that the polyp continues to recur, the patient is aware and understands that surgery may still be indicated/required.  I plan to perform an endoscopic ultrasound at time of procedure to try and decrease her risk of bleeding and ensure that varicose veins are not noted under the lesion.    The risks of EUS including bleeding, infection, aspiration pneumonia and intestinal perforation were discussed as was the possibility it may not give a definitive diagnosis.  All patient questions were answered, to the best of my ability, and the patient agrees to the aforementioned plan of action with follow-up as indicated.   PLAN  Patient to proceed to EGD with EUS and EMR attempt next week   No orders of the defined types were placed in this encounter.   New Prescriptions   No medications on file   Modified Medications   No medications on file    Planned Follow Up No follow-ups on file.   Justice Britain, MD [facility] Advanced Endoscopy Office # [phone]

## 2018-07-23 ENCOUNTER — Encounter: Payer: Self-pay | Admitting: Gastroenterology

## 2018-07-23 ENCOUNTER — Other Ambulatory Visit (HOSPITAL_COMMUNITY)
Admission: RE | Admit: 2018-07-23 | Discharge: 2018-07-23 | Disposition: A | Discharge status: Home / Self Care | Payer: Medicare Other | Source: Ambulatory Visit | Attending: Gastroenterology | Admitting: Gastroenterology

## 2018-07-23 DIAGNOSIS — D509 Iron deficiency anemia, unspecified: Secondary | ICD-10-CM | POA: Insufficient documentation

## 2018-07-23 DIAGNOSIS — Z1159 Encounter for screening for other viral diseases: Secondary | ICD-10-CM | POA: Insufficient documentation

## 2018-07-23 DIAGNOSIS — D131 Benign neoplasm of stomach: Secondary | ICD-10-CM | POA: Insufficient documentation

## 2018-07-23 LAB — SARS CORONAVIRUS 2 (TAT 6-24 HRS): SARS Coronavirus 2: NEGATIVE

## 2018-07-26 ENCOUNTER — Other Ambulatory Visit: Payer: Self-pay

## 2018-07-26 ENCOUNTER — Encounter (HOSPITAL_COMMUNITY): Payer: Self-pay | Admitting: *Deleted

## 2018-07-27 ENCOUNTER — Encounter (HOSPITAL_COMMUNITY): Payer: Self-pay | Admitting: Gastroenterology

## 2018-07-27 ENCOUNTER — Ambulatory Visit (HOSPITAL_COMMUNITY): Payer: Medicare Other | Admitting: Certified Registered"

## 2018-07-27 ENCOUNTER — Ambulatory Visit (HOSPITAL_COMMUNITY)
Admission: RE | Admit: 2018-07-27 | Discharge: 2018-07-27 | Disposition: A | Discharge status: Home / Self Care | Payer: Medicare Other | Attending: Gastroenterology | Admitting: Gastroenterology

## 2018-07-27 ENCOUNTER — Encounter (HOSPITAL_COMMUNITY)
Admission: RE | Disposition: A | Discharge status: Home / Self Care | Payer: Self-pay | Source: Home / Self Care | Attending: Gastroenterology

## 2018-07-27 DIAGNOSIS — Z800 Family history of malignant neoplasm of digestive organs: Secondary | ICD-10-CM | POA: Insufficient documentation

## 2018-07-27 DIAGNOSIS — Z79899 Other long term (current) drug therapy: Secondary | ICD-10-CM | POA: Diagnosis not present

## 2018-07-27 DIAGNOSIS — M1990 Unspecified osteoarthritis, unspecified site: Secondary | ICD-10-CM | POA: Insufficient documentation

## 2018-07-27 DIAGNOSIS — Z7982 Long term (current) use of aspirin: Secondary | ICD-10-CM | POA: Diagnosis not present

## 2018-07-27 DIAGNOSIS — K259 Gastric ulcer, unspecified as acute or chronic, without hemorrhage or perforation: Secondary | ICD-10-CM | POA: Diagnosis not present

## 2018-07-27 DIAGNOSIS — Z853 Personal history of malignant neoplasm of breast: Secondary | ICD-10-CM | POA: Diagnosis not present

## 2018-07-27 DIAGNOSIS — Z886 Allergy status to analgesic agent status: Secondary | ICD-10-CM | POA: Diagnosis not present

## 2018-07-27 DIAGNOSIS — E785 Hyperlipidemia, unspecified: Secondary | ICD-10-CM | POA: Insufficient documentation

## 2018-07-27 DIAGNOSIS — Z880 Allergy status to penicillin: Secondary | ICD-10-CM | POA: Diagnosis not present

## 2018-07-27 DIAGNOSIS — Z87442 Personal history of urinary calculi: Secondary | ICD-10-CM | POA: Diagnosis not present

## 2018-07-27 DIAGNOSIS — I8510 Secondary esophageal varices without bleeding: Secondary | ICD-10-CM | POA: Diagnosis not present

## 2018-07-27 DIAGNOSIS — K219 Gastro-esophageal reflux disease without esophagitis: Secondary | ICD-10-CM | POA: Diagnosis not present

## 2018-07-27 DIAGNOSIS — Z808 Family history of malignant neoplasm of other organs or systems: Secondary | ICD-10-CM | POA: Insufficient documentation

## 2018-07-27 DIAGNOSIS — D509 Iron deficiency anemia, unspecified: Secondary | ICD-10-CM | POA: Insufficient documentation

## 2018-07-27 DIAGNOSIS — K317 Polyp of stomach and duodenum: Secondary | ICD-10-CM | POA: Diagnosis not present

## 2018-07-27 DIAGNOSIS — I509 Heart failure, unspecified: Secondary | ICD-10-CM | POA: Insufficient documentation

## 2018-07-27 DIAGNOSIS — Z882 Allergy status to sulfonamides status: Secondary | ICD-10-CM | POA: Insufficient documentation

## 2018-07-27 DIAGNOSIS — I110 Hypertensive heart disease with heart failure: Secondary | ICD-10-CM | POA: Insufficient documentation

## 2018-07-27 DIAGNOSIS — K589 Irritable bowel syndrome without diarrhea: Secondary | ICD-10-CM | POA: Insufficient documentation

## 2018-07-27 DIAGNOSIS — Z8249 Family history of ischemic heart disease and other diseases of the circulatory system: Secondary | ICD-10-CM | POA: Insufficient documentation

## 2018-07-27 DIAGNOSIS — K7460 Unspecified cirrhosis of liver: Secondary | ICD-10-CM | POA: Diagnosis not present

## 2018-07-27 DIAGNOSIS — K449 Diaphragmatic hernia without obstruction or gangrene: Secondary | ICD-10-CM | POA: Diagnosis not present

## 2018-07-27 DIAGNOSIS — I4891 Unspecified atrial fibrillation: Secondary | ICD-10-CM | POA: Insufficient documentation

## 2018-07-27 DIAGNOSIS — E039 Hypothyroidism, unspecified: Secondary | ICD-10-CM | POA: Insufficient documentation

## 2018-07-27 DIAGNOSIS — Z8371 Family history of colonic polyps: Secondary | ICD-10-CM | POA: Insufficient documentation

## 2018-07-27 DIAGNOSIS — Z86718 Personal history of other venous thrombosis and embolism: Secondary | ICD-10-CM | POA: Insufficient documentation

## 2018-07-27 DIAGNOSIS — Z8042 Family history of malignant neoplasm of prostate: Secondary | ICD-10-CM | POA: Insufficient documentation

## 2018-07-27 DIAGNOSIS — Z91018 Allergy to other foods: Secondary | ICD-10-CM | POA: Insufficient documentation

## 2018-07-27 DIAGNOSIS — Z8379 Family history of other diseases of the digestive system: Secondary | ICD-10-CM | POA: Insufficient documentation

## 2018-07-27 DIAGNOSIS — Z888 Allergy status to other drugs, medicaments and biological substances status: Secondary | ICD-10-CM | POA: Diagnosis not present

## 2018-07-27 DIAGNOSIS — Z86010 Personal history of colonic polyps: Secondary | ICD-10-CM | POA: Insufficient documentation

## 2018-07-27 DIAGNOSIS — Z90710 Acquired absence of both cervix and uterus: Secondary | ICD-10-CM | POA: Insufficient documentation

## 2018-07-27 DIAGNOSIS — Z885 Allergy status to narcotic agent status: Secondary | ICD-10-CM | POA: Insufficient documentation

## 2018-07-27 DIAGNOSIS — Z91041 Radiographic dye allergy status: Secondary | ICD-10-CM | POA: Insufficient documentation

## 2018-07-27 DIAGNOSIS — Z9049 Acquired absence of other specified parts of digestive tract: Secondary | ICD-10-CM | POA: Insufficient documentation

## 2018-07-27 HISTORY — PX: EUS: SHX5427

## 2018-07-27 HISTORY — DX: Pneumonia, unspecified organism: J18.9

## 2018-07-27 HISTORY — DX: Other complications of anesthesia, initial encounter: T88.59XA

## 2018-07-27 HISTORY — PX: ENDOSCOPIC MUCOSAL RESECTION: SHX6839

## 2018-07-27 HISTORY — DX: Hypothyroidism, unspecified: E03.9

## 2018-07-27 HISTORY — PX: SUBMUCOSAL LIFTING INJECTION: SHX6855

## 2018-07-27 HISTORY — DX: Cardiac arrhythmia, unspecified: I49.9

## 2018-07-27 HISTORY — PX: SCLEROTHERAPY: SHX6841

## 2018-07-27 HISTORY — DX: Personal history of urinary calculi: Z87.442

## 2018-07-27 HISTORY — PX: ESOPHAGOGASTRODUODENOSCOPY (EGD) WITH PROPOFOL: SHX5813

## 2018-07-27 HISTORY — PX: HEMOSTASIS CLIP PLACEMENT: SHX6857

## 2018-07-27 SURGERY — ESOPHAGOGASTRODUODENOSCOPY (EGD) WITH PROPOFOL
Anesthesia: Monitor Anesthesia Care

## 2018-07-27 MED ORDER — LACTATED RINGERS IV SOLN
INTRAVENOUS | Status: DC
  Administered 2018-07-27: 08:00:00 via INTRAVENOUS

## 2018-07-27 MED ORDER — EPINEPHRINE 1 MG/10ML IJ SOSY
PREFILLED_SYRINGE | INTRAMUSCULAR | Status: AC
  Filled 2018-07-27: qty 20

## 2018-07-27 MED ORDER — LIDOCAINE 2% (20 MG/ML) 5 ML SYRINGE
INTRAMUSCULAR | Status: DC | PRN
  Administered 2018-07-27: 40 mg via INTRAVENOUS

## 2018-07-27 MED ORDER — SODIUM CHLORIDE (PF) 0.9 % IJ SOLN
PREFILLED_SYRINGE | INTRAMUSCULAR | Status: DC | PRN
  Administered 2018-07-27: 2 mL
  Administered 2018-07-27: 3 mL

## 2018-07-27 MED ORDER — SPOT INK MARKER SYRINGE KIT
PACK | SUBMUCOSAL | Status: AC
  Filled 2018-07-27: qty 10

## 2018-07-27 MED ORDER — PROPOFOL 500 MG/50ML IV EMUL
INTRAVENOUS | Status: DC | PRN
  Administered 2018-07-27: 100 ug/kg/min via INTRAVENOUS

## 2018-07-27 MED ORDER — SODIUM CHLORIDE 0.9 % IV SOLN: INTRAVENOUS | Status: DC

## 2018-07-27 SURGICAL SUPPLY — 14 items

## 2018-07-27 NOTE — Anesthesia Preprocedure Evaluation (Signed)
Anesthesia Evaluation   Patient awake    Reviewed: NPO status , Patient's Chart, lab work & pertinent test results  Airway Mallampati: II  TM Distance: >3 FB     Dental   Pulmonary pneumonia,    breath sounds clear to auscultation       Cardiovascular hypertension, +CHF  + dysrhythmias  Rhythm:Regular Rate:Normal     Neuro/Psych    GI/Hepatic negative GI ROS, Neg liver ROS,   Endo/Other  Hypothyroidism   Renal/GU      Musculoskeletal  (+) Arthritis ,   Abdominal   Peds  Hematology   Anesthesia Other Findings   Reproductive/Obstetrics                             Anesthesia Physical Anesthesia Plan  ASA: III  Anesthesia Plan: MAC   Post-op Pain Management:    Induction: Intravenous  PONV Risk Score and Plan: Ondansetron and Midazolam  Airway Management Planned: Simple Face Mask  Additional Equipment:   Intra-op Plan:   Post-operative Plan:   Informed Consent: I have reviewed the patients History and Physical, chart, labs and discussed the procedure including the risks, benefits and alternatives for the proposed anesthesia with the patient or authorized representative who has indicated his/her understanding and acceptance.     Dental advisory given  Plan Discussed with: CRNA  Anesthesia Plan Comments:         Anesthesia Quick Evaluation

## 2018-07-27 NOTE — Discharge Instructions (Signed)
YOU HAD AN ENDOSCOPIC PROCEDURE TODAY: Refer to the procedure report and other information in the discharge instructions given to you for any specific questions about what was found during the examination. If this information does not answer your questions, please call [place] office at [phone] to clarify.   YOU SHOULD EXPECT: Some feelings of bloating in the abdomen. Passage of more gas than usual. Walking can help get rid of the air that was put into your GI tract during the procedure and reduce the bloating. If you had a lower endoscopy (such as a colonoscopy or flexible sigmoidoscopy) you may notice spotting of blood in your stool or on the toilet paper. Some abdominal soreness may be present for a day or two, also.  DIET: Your first meal following the procedure should be a light meal and then it is ok to progress to your normal diet. A half-sandwich or bowl of soup is an example of a good first meal. Heavy or fried foods are harder to digest and may make you feel nauseous or bloated. Drink plenty of fluids but you should avoid alcoholic beverages for 24 hours. If you had a esophageal dilation, please see attached instructions for diet.    ACTIVITY: Your care partner should take you home directly after the procedure. You should plan to take it easy, moving slowly for the rest of the day. You can resume normal activity the day after the procedure however YOU SHOULD NOT DRIVE, use power tools, machinery or perform tasks that involve climbing or major physical exertion for 24 hours (because of the sedation medicines used during the test).   SYMPTOMS TO REPORT IMMEDIATELY: A gastroenterologist can be reached at any hour. Please call [phone]  for any of the following symptoms:   Following upper endoscopy (EGD, EUS, ERCP, esophageal dilation) Vomiting of blood or coffee ground material  New, significant abdominal pain  New, significant chest pain or pain under the shoulder blades  Painful or  persistently difficult swallowing  New shortness of breath  Black, tarry-looking or red, bloody stools  FOLLOW UP:  If any biopsies were taken you will be contacted by phone or by letter within the next 1-3 weeks. Call [phone]  if you have not heard about the biopsies in 3 weeks.  Please also call with any specific questions about appointments or follow up tests.  

## 2018-07-27 NOTE — Interval H&P Note (Signed)
History and Physical Interval Note:  07/27/2018 7:35 AM  Deborah Ball  has presented today for surgery, with the diagnosis of gastric polypectomy.  The various methods of treatment have been discussed with the patient and family. After consideration of risks, benefits and other options for treatment, the patient has consented to  Procedure(s): ESOPHAGOGASTRODUODENOSCOPY (EGD) WITH PROPOFOL (N/A) UPPER ENDOSCOPIC ULTRASOUND (EUS) RADIAL (N/A) ENDOSCOPIC MUCOSAL RESECTION (N/A) as a surgical intervention.  The patient's history has been reviewed, patient examined, no change in status, stable for surgery.  I have reviewed the patient's chart and labs.  Questions were answered to the patient's satisfaction.     [employer]

## 2018-07-27 NOTE — Transfer of Care (Signed)
Immediate Anesthesia Transfer of Care Note  Patient: Deborah Ball  Procedure(s) Performed: ESOPHAGOGASTRODUODENOSCOPY (EGD) WITH PROPOFOL (N/A ) UPPER ENDOSCOPIC ULTRASOUND (EUS) RADIAL (N/A ) ENDOSCOPIC MUCOSAL RESECTION (N/A ) SUBMUCOSAL LIFTING INJECTION SCLEROTHERAPY HOT HEMOSTASIS (ARGON PLASMA COAGULATION/BICAP) (N/A ) HEMOSTASIS CLIP PLACEMENT  Patient Location: Endoscopy Unit  Anesthesia Type:MAC  Level of Consciousness: awake and oriented  Airway & Oxygen Therapy: Patient Spontanous Breathing  Post-op Assessment: Report given to RN  Post vital signs: Reviewed and stable  Last Vitals:  Vitals Value Taken Time  BP    Temp    Pulse 96 07/27/18 0938  Resp 23 07/27/18 0938  SpO2 96 % 07/27/18 0938  Vitals shown include unvalidated device data.  Last Pain:  Vitals:   07/27/18 0800  TempSrc: Axillary  PainSc: 0-No pain         Complications: No apparent anesthesia complications

## 2018-07-27 NOTE — Anesthesia Postprocedure Evaluation (Signed)
Anesthesia Post Note  Patient: Deborah Ball  Procedure(s) Performed: ESOPHAGOGASTRODUODENOSCOPY (EGD) WITH PROPOFOL (N/A ) UPPER ENDOSCOPIC ULTRASOUND (EUS) RADIAL (N/A ) ENDOSCOPIC MUCOSAL RESECTION (N/A ) SUBMUCOSAL LIFTING INJECTION SCLEROTHERAPY HOT HEMOSTASIS (ARGON PLASMA COAGULATION/BICAP) (N/A ) HEMOSTASIS CLIP PLACEMENT     Patient location during evaluation: Endoscopy Anesthesia Type: MAC Level of consciousness: awake Pain management: pain level controlled Vital Signs Assessment: post-procedure vital signs reviewed and stable Respiratory status: spontaneous breathing Cardiovascular status: stable Postop Assessment: no apparent nausea or vomiting Anesthetic complications: no    Last Vitals:  Vitals:   07/27/18 0939 07/27/18 0949  BP: (!) 121/44 105/63  Pulse: 96 90  Resp: (!) 23 16  Temp: (!) 36.2 C   SpO2: 96% 95%    Last Pain:  Vitals:   07/27/18 0949  TempSrc:   PainSc: 0-No pain                 [name]

## 2018-07-27 NOTE — Op Note (Signed)
[facility] Patient Name: Deborah Ball Procedure Date : 07/27/2018 MRN: [id number] Attending MD: Justice Britain , MD Date of Birth: 1922/03/19 CSN: [id number] Age: 85 Admit Type: Outpatient Procedure:                Upper EUS Indications:              Gastric mucosal mass/polyp found on endoscopy,                            Gastric polyps, For therapy of gastric polyps Providers:                Justice Britain, MD, Vista Lawman, RN, William Dalton, Technician Referring MD:             Jackquline Denmark, MD Medicines:                Monitored Anesthesia Care Complications:            No immediate complications. Estimated Blood Loss:     Estimated blood loss was minimal. Procedure:                Pre-Anesthesia Assessment:                           - Prior to the procedure, a History and Physical                            was performed, and patient medications and                            allergies were reviewed. The patient's tolerance of                            previous anesthesia was also reviewed. The risks                            and benefits of the procedure and the sedation                            options and risks were discussed with the patient.                            All questions were answered, and informed consent                            was obtained. Prior Anticoagulants: The patient has                            taken no previous anticoagulant or antiplatelet                            agents except for aspirin. ASA Grade Assessment:  III - A patient with severe systemic disease. After                            reviewing the risks and benefits, the patient was                            deemed in satisfactory condition to undergo the                            procedure.                           After obtaining informed consent, the endoscope was                            passed  under direct vision. Throughout the                            procedure, the patient's blood pressure, pulse, and                            oxygen saturations were monitored continuously. The                            [id number] ([id number]) Olympus therapeutic                            gastroscope was introduced through the mouth, and                            advanced to the second part of duodenum. After                            obtaining informed consent, the endoscope was                            passed under direct vision. Throughout the                            procedure, the patient's blood pressure, pulse, and                            oxygen saturations were monitored continuously. The                            [id number] ([id number]) Olympus Radial EUS scope was                            introduced through the mouth, and advanced to the                            duodenum for ultrasound examination from the  stomach and duodenum. The upper EUS was                            accomplished without difficulty. The patient                            tolerated the procedure. Scope In: Scope Out: Findings:      ENDOSCOPIC FINDING: :      Grade I varices were found in the proximal esophagus.      A small hiatal hernia was present.      A single 18 mm semi-sessile polyp with bleeding and stigmata of recent       bleeding was found in the cardia. After EUS was completed, preparations       were made for mucosal resection. Orise gel was injected to raise the       lesion. This did not allow complete rise of the area. I then injected       Epinephrine to decrease risk of bleeding. Piecemeal mucosal resection       using a snare was performed. Resection and retrieval were complete. To       prevent bleeding after mucosal resection, five hemostatic clips were       successfully placed (MR conditional). There was no bleeding at the end       of the  procedure after watching for approximately 10 minutes post       resection.      No gross lesions were noted in the entire examined stomach otherwise.      No gross lesions were noted in the duodenal bulb, in the first portion       of the duodenum and in the second portion of the duodenum.      ENDOSONOGRAPHIC FINDING: :      A hypoechoic irregular lesion was identified endosonographically in the       cardia of the stomach. The lesion measured 13 mm by 13 mm in maximal       cross-sectional diameter. The endosonographic borders were well-defined.       There was sonographic evidence suggesting invasion into the luminal       interface/superficial mucosa (Layer 1) and the deep mucosa (Layer 2).      Endosonographic imaging in the visualized portion of the liver showed no       mass.      The celiac region was visualized. Impression:               EGD Impression:                           - Grade I esophageal varices.                           - Small hiatal hernia.                           - A single bleeding/oozing gastric polyp in hiatal                            hernia. Resected and retrieved via mucosal  resection. Clips (MR conditional) were placed to                            decrease risk of bleeding.                           - No other gross lesions in the stomach.                           - No gross lesions in the duodenal bulb, in the                            first portion of the duodenum and in the second                            portion of the duodenum.                           EUS Impression:                           - A lesion was found in the cardia of the stomach.                            A tissue diagnosis was obtained prior to this exam.                            This is consistent with inflammatory polyp. Recommendation:           - The patient will be observed post-procedure,                            until all discharge  criteria are met.                           - Discharge patient to home.                           - Patient has a contact number available for                            emergencies. The signs and symptoms of potential                            delayed complications were discussed with the                            patient. Return to normal activities tomorrow.                            Written discharge instructions were provided to the                            patient.                           -  Observe patient's clinical course.                           - Await path results.                           - Repeat the upper endoscopic ultrasound in                            37-month to 1 year for surveillance based on final                            pathology.                           - Liquid Diet x 24 hours (OK for soups). Then Soft                            diet for 72 hours. Then advance as tolerated.                           - Monitor for signs/symptoms of bleeding (dark                            stools, vomiting blood or old blood), or                            perforation (pain).                           - The findings and recommendations were discussed                            with the patient.                           - The findings and recommendations were discussed                            with the patient's family. Procedure Code(s):        --- Professional ---                           [id number] Esophagogastroduodenoscopy, flexible,                            transoral; with endoscopic mucosal resection Diagnosis Code(s):        --- Professional ---                           I85.00, Esophageal varices without bleeding                           K44.9, Diaphragmatic hernia without obstruction or  gangrene                           K31.7, Polyp of stomach and duodenum                           K31.89, Other diseases of stomach and  duodenum CPT copyright 2019 [employer]. All rights reserved. The codes documented in this report are preliminary and upon coder review may  be revised to meet current compliance requirements. Justice Britain, MD 07/27/2018 10:09:47 AM Number of Addenda: 0

## 2018-07-27 NOTE — Anesthesia Procedure Notes (Signed)
Procedure Name: MAC Date/Time: 07/27/2018 8:33 AM Performed by: Barrington Ellison, CRNA Pre-anesthesia Checklist: Patient identified, Emergency Drugs available, Suction available, Patient being monitored and Timeout performed Patient Re-evaluated:Patient Re-evaluated prior to induction Oxygen Delivery Method: Nasal cannula

## 2018-07-28 ENCOUNTER — Encounter: Payer: Self-pay | Admitting: Gastroenterology

## 2018-07-30 ENCOUNTER — Encounter (HOSPITAL_COMMUNITY): Payer: Self-pay | Admitting: Gastroenterology

## 2018-08-23 DIAGNOSIS — M810 Age-related osteoporosis without current pathological fracture: Secondary | ICD-10-CM | POA: Diagnosis not present

## 2018-08-23 DIAGNOSIS — M47816 Spondylosis without myelopathy or radiculopathy, lumbar region: Secondary | ICD-10-CM | POA: Diagnosis not present

## 2018-08-30 DIAGNOSIS — Z9181 History of falling: Secondary | ICD-10-CM | POA: Diagnosis not present

## 2018-08-30 DIAGNOSIS — I4819 Other persistent atrial fibrillation: Secondary | ICD-10-CM | POA: Diagnosis not present

## 2018-08-30 DIAGNOSIS — K7469 Other cirrhosis of liver: Secondary | ICD-10-CM | POA: Diagnosis not present

## 2018-08-30 DIAGNOSIS — K317 Polyp of stomach and duodenum: Secondary | ICD-10-CM | POA: Diagnosis not present

## 2018-08-30 DIAGNOSIS — K5520 Angiodysplasia of colon without hemorrhage: Secondary | ICD-10-CM | POA: Diagnosis not present

## 2018-08-30 DIAGNOSIS — N183 Chronic kidney disease, stage 3 (moderate): Secondary | ICD-10-CM | POA: Diagnosis not present

## 2018-08-30 DIAGNOSIS — I8510 Secondary esophageal varices without bleeding: Secondary | ICD-10-CM | POA: Insufficient documentation

## 2018-08-30 DIAGNOSIS — M8000XS Age-related osteoporosis with current pathological fracture, unspecified site, sequela: Secondary | ICD-10-CM | POA: Diagnosis not present

## 2018-08-30 DIAGNOSIS — D509 Iron deficiency anemia, unspecified: Secondary | ICD-10-CM | POA: Diagnosis not present

## 2018-09-02 DIAGNOSIS — N2889 Other specified disorders of kidney and ureter: Secondary | ICD-10-CM | POA: Diagnosis not present

## 2018-09-02 DIAGNOSIS — N281 Cyst of kidney, acquired: Secondary | ICD-10-CM | POA: Diagnosis not present

## 2018-09-02 DIAGNOSIS — Q6102 Congenital multiple renal cysts: Secondary | ICD-10-CM | POA: Diagnosis not present

## 2018-09-08 DIAGNOSIS — L578 Other skin changes due to chronic exposure to nonionizing radiation: Secondary | ICD-10-CM | POA: Diagnosis not present

## 2018-09-08 DIAGNOSIS — L570 Actinic keratosis: Secondary | ICD-10-CM | POA: Diagnosis not present

## 2018-09-08 DIAGNOSIS — L821 Other seborrheic keratosis: Secondary | ICD-10-CM | POA: Diagnosis not present

## 2018-09-21 ENCOUNTER — Encounter: Payer: Self-pay | Admitting: Sports Medicine

## 2018-09-21 ENCOUNTER — Ambulatory Visit (INDEPENDENT_AMBULATORY_CARE_PROVIDER_SITE_OTHER): Payer: Medicare Other | Admitting: Sports Medicine

## 2018-09-21 ENCOUNTER — Other Ambulatory Visit: Payer: Self-pay

## 2018-09-21 DIAGNOSIS — L84 Corns and callosities: Secondary | ICD-10-CM

## 2018-09-21 DIAGNOSIS — M79671 Pain in right foot: Secondary | ICD-10-CM | POA: Diagnosis not present

## 2018-09-21 DIAGNOSIS — M79672 Pain in left foot: Secondary | ICD-10-CM

## 2018-09-21 DIAGNOSIS — B351 Tinea unguium: Secondary | ICD-10-CM | POA: Diagnosis not present

## 2018-09-21 DIAGNOSIS — I739 Peripheral vascular disease, unspecified: Secondary | ICD-10-CM

## 2018-09-21 DIAGNOSIS — Z7901 Long term (current) use of anticoagulants: Secondary | ICD-10-CM

## 2018-09-21 NOTE — Progress Notes (Signed)
Subjective: Delane Stalling is a 85 y.o. female patient seen today in office with complaint of mildly painful thickened and elongated toenails and  mildly painful corn on bottom of right foot; unable to trim. Patient is still on blood thinner, Xarelto that has been restarted after polyp procedure. Patient is assisted by daughter this visit.   Patient Active Problem List   Diagnosis Date Noted  . 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 Iron deficiency anemia 07/23/2018     Current Outpatient Medications:  .  aspirin EC 81 MG tablet, Take 81 mg by mouth daily. , Disp: , Rfl:  .  cholecalciferol (VITAMIN D) 25 MCG (1000 UT) tablet, Take 1,000 Units by mouth 2 (two) times a day., Disp: , Rfl:  .  Denosumab (PROLIA ), Inject 1 Dose into the skin every 6 (six) months. , Disp: , Rfl:  .  diltiazem (CARTIA XT) 300 MG 24 hr capsule, Take 300 mg by mouth daily., Disp: , Rfl:  .  ferrous sulfate 325 (65 FE) MG tablet, Take 325 mg by mouth every Monday, Wednesday, and Friday. At lunch, Disp: , Rfl:  .  furosemide (LASIX) 20 MG tablet, Take 20 mg by mouth daily as needed (for swelling or high sodium intake--with potassium). , Disp: , Rfl:  .  levothyroxine (SYNTHROID, LEVOTHROID) 50 MCG tablet, Take 50 mcg by mouth daily before breakfast. , Disp: , Rfl:  .  mupirocin ointment (BACTROBAN) 2 %, Place 1 application into the nose as needed (for skin wounds). , Disp: , Rfl:  .  omeprazole (PRILOSEC) 20 MG capsule, Take 20 mg by mouth 2 (two) times a day., Disp: , Rfl:  .  potassium chloride (K-DUR,KLOR-CON) 10 MEQ tablet, Take 10 mEq by mouth daily as needed (for swelling or high sodium intake---with lasix). , Disp: , Rfl:  .  Propylene Glycol (SYSTANE BALANCE) 0.6 % SOLN, Place 1 drop into both eyes as needed (for dry eyes). , Disp: , Rfl:   Current Facility-Administered Medications:  .  0.9 %  sodium chloride infusion, 500 mL, Intravenous, Once, Jackquline Denmark, MD  Allergies  Allergen  Reactions  . Other Anaphylaxis and Other (See Comments)    Phenbarbitol-unknown reaction, but pt had been told allergic Contrast Dye   . Statins Other (See Comments)    Joint pain.   . Sulfur Rash  . Clarithromycin Other (See Comments)    Joint pain   . Iodinated Diagnostic Agents Other (See Comments)    Tremors and shaking  . Morphine Other (See Comments)    AMS, Agitation    . Aspirin     Bruising per patient  . Ciprofloxacin Other (See Comments)    joints  . Food     NUTS AND SEEDS == DUE TO diverticulitis  DOES NOT EAT CHOCOLATE OR TAKE CAFFEINE PRODUCTS  . Penicillins     Did it involve swelling of the face/tongue/throat, SOB, or low BP? Unknown Did it involve sudden or severe rash/hives, skin peeling, or any reaction on the inside of your mouth or nose? Unknown Did you need to seek medical attention at a hospital or doctor's office? Unknown When did it last happen?unable to clarify If all above answers are "NO", may proceed with cephalosporin use.   Marland Kitchen Phenobarbital     Other reaction(s): UNKNOWN  . Sulfa Antibiotics Rash    Objective: Physical Exam  General: Well developed, nourished, no acute distress, awake, alert and oriented x 3  Vascular: Dorsalis pedis  artery 1/4 bilateral, Posterior tibial artery 0/4 bilateral due to trace edema at ankles, skin temperature warm to warm proximal to distal bilateral lower extremities, + varicosities, scant pedal hair present bilateral.  Neurological: Gross sensation present via light touch bilateral.   Dermatological: Skin is warm, dry, and supple bilateral, Nails 1-10 are tender, long, thick, and discolored with mild subungal debris, no webspace macerations present bilateral, no open lesions present bilateral, + hyperkeratotic tissue present right sub met 1.  No signs of infection bilateral.  Musculoskeletal: Mild pain to callus area on right foot. Asymptomatic hammertoe boney deformities noted bilateral. Muscular strength  within normal limits without pain on range of motion. No pain with calf compression bilateral.  Assessment and Plan:  Problem List Items Addressed This Visit    None    Visit Diagnoses    Dermatophytosis of nail    -  Primary   Foot pain, bilateral       Corns and callosities       PVD (peripheral vascular disease) ([place])       Current use of long term anticoagulation         -Examined patient.  -Discussed treatment options for painful mycotic nails and callus. -Mechanically debrided callus x 1 using sterile chisel blade at no additional charge and reduced mycotic nails with sterile nail nipper and dremel nail file without incident. -Continue with good supportive shoes daily for foot type with padding at the ball to slow callus recurrence -Patient to return in 3 months for follow up evaluation or sooner if symptoms worsen.  Landis Martins, DPM

## 2018-11-01 DIAGNOSIS — Z23 Encounter for immunization: Secondary | ICD-10-CM | POA: Diagnosis not present

## 2018-12-07 DIAGNOSIS — H353131 Nonexudative age-related macular degeneration, bilateral, early dry stage: Secondary | ICD-10-CM | POA: Diagnosis not present

## 2018-12-07 DIAGNOSIS — H5213 Myopia, bilateral: Secondary | ICD-10-CM | POA: Diagnosis not present

## 2018-12-07 DIAGNOSIS — H04123 Dry eye syndrome of bilateral lacrimal glands: Secondary | ICD-10-CM | POA: Diagnosis not present

## 2018-12-07 DIAGNOSIS — H52223 Regular astigmatism, bilateral: Secondary | ICD-10-CM | POA: Diagnosis not present

## 2018-12-07 DIAGNOSIS — H524 Presbyopia: Secondary | ICD-10-CM | POA: Diagnosis not present

## 2018-12-14 ENCOUNTER — Ambulatory Visit: Payer: Medicare Other | Admitting: Sports Medicine

## 2018-12-15 ENCOUNTER — Ambulatory Visit: Payer: Medicare Other

## 2018-12-15 ENCOUNTER — Ambulatory Visit (INDEPENDENT_AMBULATORY_CARE_PROVIDER_SITE_OTHER): Payer: Medicare Other | Admitting: Podiatry

## 2018-12-15 ENCOUNTER — Other Ambulatory Visit: Payer: Self-pay

## 2018-12-15 VITALS — Temp 96.4°F

## 2018-12-15 DIAGNOSIS — B351 Tinea unguium: Secondary | ICD-10-CM | POA: Diagnosis not present

## 2018-12-15 DIAGNOSIS — M79674 Pain in right toe(s): Secondary | ICD-10-CM | POA: Diagnosis not present

## 2018-12-15 DIAGNOSIS — L84 Corns and callosities: Secondary | ICD-10-CM

## 2018-12-15 DIAGNOSIS — S99922A Unspecified injury of left foot, initial encounter: Secondary | ICD-10-CM

## 2018-12-15 DIAGNOSIS — M79675 Pain in left toe(s): Secondary | ICD-10-CM | POA: Diagnosis not present

## 2018-12-15 DIAGNOSIS — S92535A Nondisplaced fracture of distal phalanx of left lesser toe(s), initial encounter for closed fracture: Secondary | ICD-10-CM

## 2018-12-15 DIAGNOSIS — I739 Peripheral vascular disease, unspecified: Secondary | ICD-10-CM | POA: Diagnosis not present

## 2018-12-25 ENCOUNTER — Encounter: Payer: Self-pay | Admitting: Podiatry

## 2018-12-25 NOTE — Progress Notes (Signed)
Subjective: Deborah Ball presents to clinic with cc of painful mycotic toenails and plantar callus which are aggravated when weightbearing with and without shoe gear.  This pain limits her daily activities. Pain symptoms resolve with periodic professional debridement.  She relates she stubbed her left foot about 4 days ago. Her left 3rd toe is swollen and tender at DIPJ. She has not sought any professional help with this injury.  Raina Mina., MD is her PCP.   Medications reviewed in chart.  Allergies  Allergen Reactions  . Other Anaphylaxis and Other (See Comments)    Phenbarbitol-unknown reaction, but pt had been told allergic Contrast Dye   . Statins Other (See Comments)    Joint pain.   . Sulfur Rash  . Clarithromycin Other (See Comments)    Joint pain   . Iodinated Diagnostic Agents Other (See Comments)    Tremors and shaking  . Morphine Other (See Comments)    AMS, Agitation    . Aspirin     Bruising per patient  . Ciprofloxacin Other (See Comments)    joints  . Food     NUTS AND SEEDS == DUE TO diverticulitis  DOES NOT EAT CHOCOLATE OR TAKE CAFFEINE PRODUCTS  . Penicillins     Did it involve swelling of the face/tongue/throat, SOB, or low BP? Unknown Did it involve sudden or severe rash/hives, skin peeling, or any reaction on the inside of your mouth or nose? Unknown Did you need to seek medical attention at a hospital or doctor's office? Unknown When did it last happen?unable to clarify If all above answers are "NO", may proceed with cephalosporin use.   Marland Kitchen Phenobarbital     Other reaction(s): UNKNOWN  . Sulfa Antibiotics Rash     Objective: Vitals:   12/15/18 1111  Temp: (!) 96.4 F (35.8 C)    Physical Examination:  Vascular  Examination: Capillary refill time immediate b/l.  DP pulses 1/4 b/l.  PT pulses 0/4 b/l.  Digital hair sparse b/l.  No edema noted b/l.  Skin temperature gradient WNL b/l.  Dermatological  Examination: Skin with normal turgor, texture and tone b/l.  No open wounds b/l.  No interdigital macerations noted b/l.  Elongated, thick, discolored brittle toenails with subungual debris and pain on dorsal palpation of nailbeds 1-5 b/l.  Porokeratotic lesion submet head 1 right foot with tenderness to palpation. No edema, no erythema, no drainage, no flocculence.   Hyperkeratotic lesion submet head 1 left foot. No erythema, no edema, no drainage, no flocculence.  Musculoskeletal Examination: Muscle strength 5/5 to all muscle groups b/l.  Hammertoes lesser digits b/l.  +Tenderness to left 3rd digit. +Ecchymosis and mild edema at DIPJ.    No pain, crepitus or joint discomfort with active/passive ROM.  Neurological Examination: Sensation intact 5/5 b/l with 10 gram monofilament.  Vibratory sensation intact b/l.  Xrays left foot: Small nondisplaced fracture noted base of lateral aspect distal phalanx. Good alignment.  Assessment: 1. Mycotic nail infection with pain 1-5 b/l 2. Porokeratotic lesion submet head 1 right foot 3. Callus submet head 1 left foot 4. Fracture left 3rd digit distal phalanx, nondisplaced  Plan: 1. Discussed fracture left 3rd digit and instructed on buddy splinting to left 2nd toe. 2. Toenails 1-5 b/l were debrided in length and girth without iatrogenic laceration.  3. Porokeratosis submet head 1 right foot pared and enucleated with sterile scalpel blade without incident. 4. Callus pared submetatarsal head 1 left foot utilizing sterile scalpel blade without incident.  5. Continue soft, supportive shoe gear daily. 6. Report any pedal injuries to medical professional. 7. Follow up 3 months. 8. Patient/POA to call should there be a question/concern in there interim.

## 2019-03-17 ENCOUNTER — Ambulatory Visit: Payer: Medicare Other | Admitting: Sports Medicine

## 2019-03-23 ENCOUNTER — Ambulatory Visit (INDEPENDENT_AMBULATORY_CARE_PROVIDER_SITE_OTHER): Payer: Medicare Other | Admitting: Sports Medicine

## 2019-03-23 ENCOUNTER — Encounter: Payer: Self-pay | Admitting: Sports Medicine

## 2019-03-23 ENCOUNTER — Other Ambulatory Visit: Payer: Self-pay

## 2019-03-23 DIAGNOSIS — M79675 Pain in left toe(s): Secondary | ICD-10-CM | POA: Diagnosis not present

## 2019-03-23 DIAGNOSIS — M79674 Pain in right toe(s): Secondary | ICD-10-CM | POA: Diagnosis not present

## 2019-03-23 DIAGNOSIS — I739 Peripheral vascular disease, unspecified: Secondary | ICD-10-CM | POA: Diagnosis not present

## 2019-03-23 DIAGNOSIS — L84 Corns and callosities: Secondary | ICD-10-CM

## 2019-03-23 DIAGNOSIS — B351 Tinea unguium: Secondary | ICD-10-CM | POA: Diagnosis not present

## 2019-03-23 NOTE — Progress Notes (Signed)
Subjective: Jazz Biddy is a 86 y.o. female patient seen today in office with complaint of mildly painful thickened and elongated toenails and  mildly painful corn on bottom of right foot; unable to trim. Patient is assisted by daughter.  Patient reports that her left 3rd toe no longer bothers her.    Patient denies any changes with medication or any other complaints at this time.  Patient Active Problem List   Diagnosis Date Noted  . 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 Iron deficiency anemia 07/23/2018     Current Outpatient Medications:  .  aspirin EC 81 MG tablet, Take 81 mg by mouth daily. , Disp: , Rfl:  .  cholecalciferol (VITAMIN D) 25 MCG (1000 UT) tablet, Take 1,000 Units by mouth 2 (two) times a day., Disp: , Rfl:  .  Denosumab (PROLIA [place]), Inject 1 Dose into the skin every 6 (six) months. , Disp: , Rfl:  .  diclofenac Sodium (VOLTAREN) 1 % GEL, SMARTSIG:1 Sparingly Topical 3 Times Daily PRN, Disp: , Rfl:  .  diltiazem (CARTIA XT) 300 MG 24 hr capsule, Take 300 mg by mouth daily., Disp: , Rfl:  .  ferrous sulfate 325 (65 FE) MG tablet, Take 325 mg by mouth every Monday, Wednesday, and Friday. At lunch, Disp: , Rfl:  .  furosemide (LASIX) 20 MG tablet, Take 20 mg by mouth daily as needed (for swelling or high sodium intake--with potassium). , Disp: , Rfl:  .  levothyroxine (SYNTHROID, LEVOTHROID) 50 MCG tablet, Take 50 mcg by mouth daily before breakfast. , Disp: , Rfl:  .  mupirocin ointment (BACTROBAN) 2 %, Place 1 application into the nose as needed (for skin wounds). , Disp: , Rfl:  .  omeprazole (PRILOSEC) 20 MG capsule, Take 20 mg by mouth 2 (two) times a day., Disp: , Rfl:  .  potassium chloride (K-DUR,KLOR-CON) 10 MEQ tablet, Take 10 mEq by mouth daily as needed (for swelling or high sodium intake---with lasix). , Disp: , Rfl:  .  Propylene Glycol (SYSTANE BALANCE) 0.6 % SOLN, Place 1 drop into both eyes as needed (for dry eyes). , Disp: , Rfl:  .   XARELTO 15 MG TABS tablet, Take 15 mg by mouth daily., Disp: , Rfl:   Current Facility-Administered Medications:  .  0.9 %  sodium chloride infusion, 500 mL, Intravenous, Once, Jackquline Denmark, MD  Allergies  Allergen Reactions  . Other Anaphylaxis and Other (See Comments)    Phenbarbitol-unknown reaction, but pt had been told allergic Contrast Dye   . Statins Other (See Comments)    Joint pain.   . Sulfur Rash  . Clarithromycin Other (See Comments)    Joint pain   . Iodinated Diagnostic Agents Other (See Comments)    Tremors and shaking  . Morphine Other (See Comments)    AMS, Agitation    . Aspirin     Bruising per patient  . Ciprofloxacin Other (See Comments)    joints  . Food     NUTS AND SEEDS == DUE TO diverticulitis  DOES NOT EAT CHOCOLATE OR TAKE CAFFEINE PRODUCTS  . Penicillins     Did it involve swelling of the face/tongue/throat, SOB, or low BP? Unknown Did it involve sudden or severe rash/hives, skin peeling, or any reaction on the inside of your mouth or nose? Unknown Did you need to seek medical attention at a hospital or doctor's office? Unknown When did it last happen?unable to clarify If all above answers  are "NO", may proceed with cephalosporin use.   Marland Kitchen Phenobarbital     Other reaction(s): UNKNOWN  . Sulfa Antibiotics Rash    Objective: Physical Exam  General: Well developed, nourished, no acute distress, awake, alert and oriented x 3  Vascular: Dorsalis pedis artery 1/4 bilateral, Posterior tibial artery 0/4 bilateral due to trace edema at ankles, skin temperature warm to warm proximal to distal bilateral lower extremities, + varicosities, scant pedal hair present bilateral.  Neurological: Gross sensation present via light touch bilateral.   Dermatological: Skin is warm, dry, and supple bilateral, Nails 1-10 are tender, long, thick, and discolored with mild subungal debris, no webspace macerations present bilateral, no open lesions present  bilateral, + hyperkeratotic tissue present right sub met 1.  No signs of infection bilateral.  Musculoskeletal: Mild pain to callus area on right foot. Asymptomatic hammertoe boney deformities noted bilateral. Muscular strength within normal limits without pain on range of motion. No pain with calf compression bilateral.  Assessment and Plan:  Problem List Items Addressed This Visit    None    Visit Diagnoses    Pain due to onychomycosis of toenails of both feet    -  Primary   Callus       PAD (peripheral artery disease) ([facility])       Relevant Medications   XARELTO 15 MG TABS tablet      -Examined patient.  -Discussed treatment options for painful mycotic nails and callus. -Mechanically debrided callus x 1 using sterile chisel blade at no additional charge and reduced mycotic nails with sterile nail nipper and dremel nail file without incident. -Continue with good supportive shoes daily for foot type with padding at the ball to slow callus recurrence; more pads given this visit -Patient to return in 3 months for follow up evaluation or sooner if symptoms worsen.  Landis Martins, DPM

## 2019-04-23 IMAGING — DX DG HIP (WITH OR WITHOUT PELVIS) 2-3V*R*
3 series · 4 of 4 positions shown · non-contrast
Comparison: 10/31/2015

CLINICAL DATA: Arrived via EMS from patient's home. Patient fell
one week ago inside her daughter's home while walking with her
walker and had right hip pain with ecchymosis. Pt was at the home
today and patient having increased right hip pain...*comment was
truncated*

EXAM:
DG HIP (WITH OR WITHOUT PELVIS) 2-3V RIGHT

[pelvis ap]
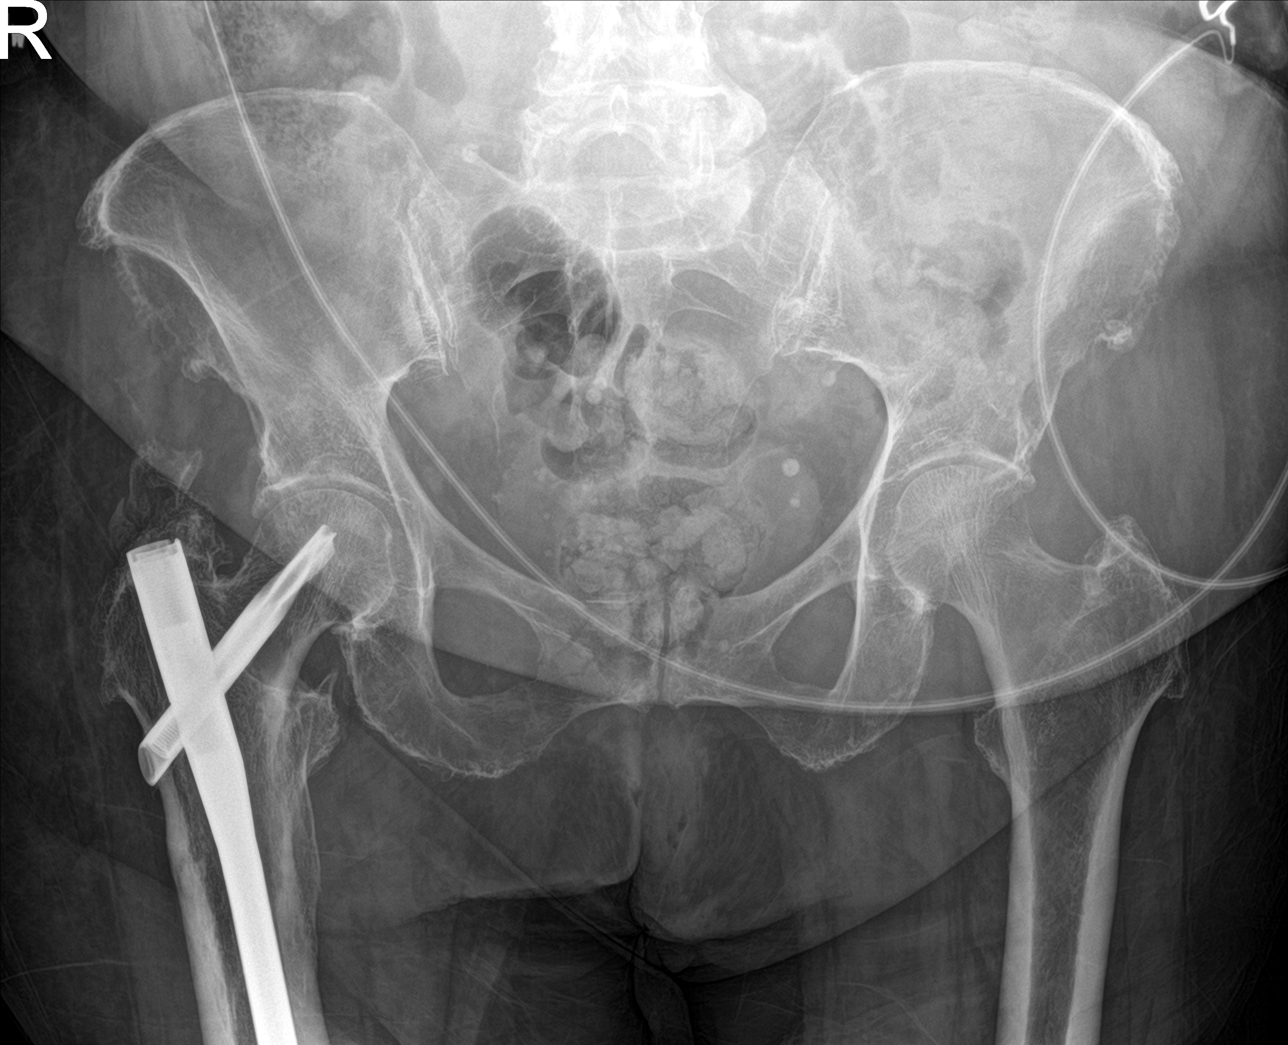

[Series 2: hip ap · 0.14mm/px · 2 of 2 slices shown]
[im 1/2]
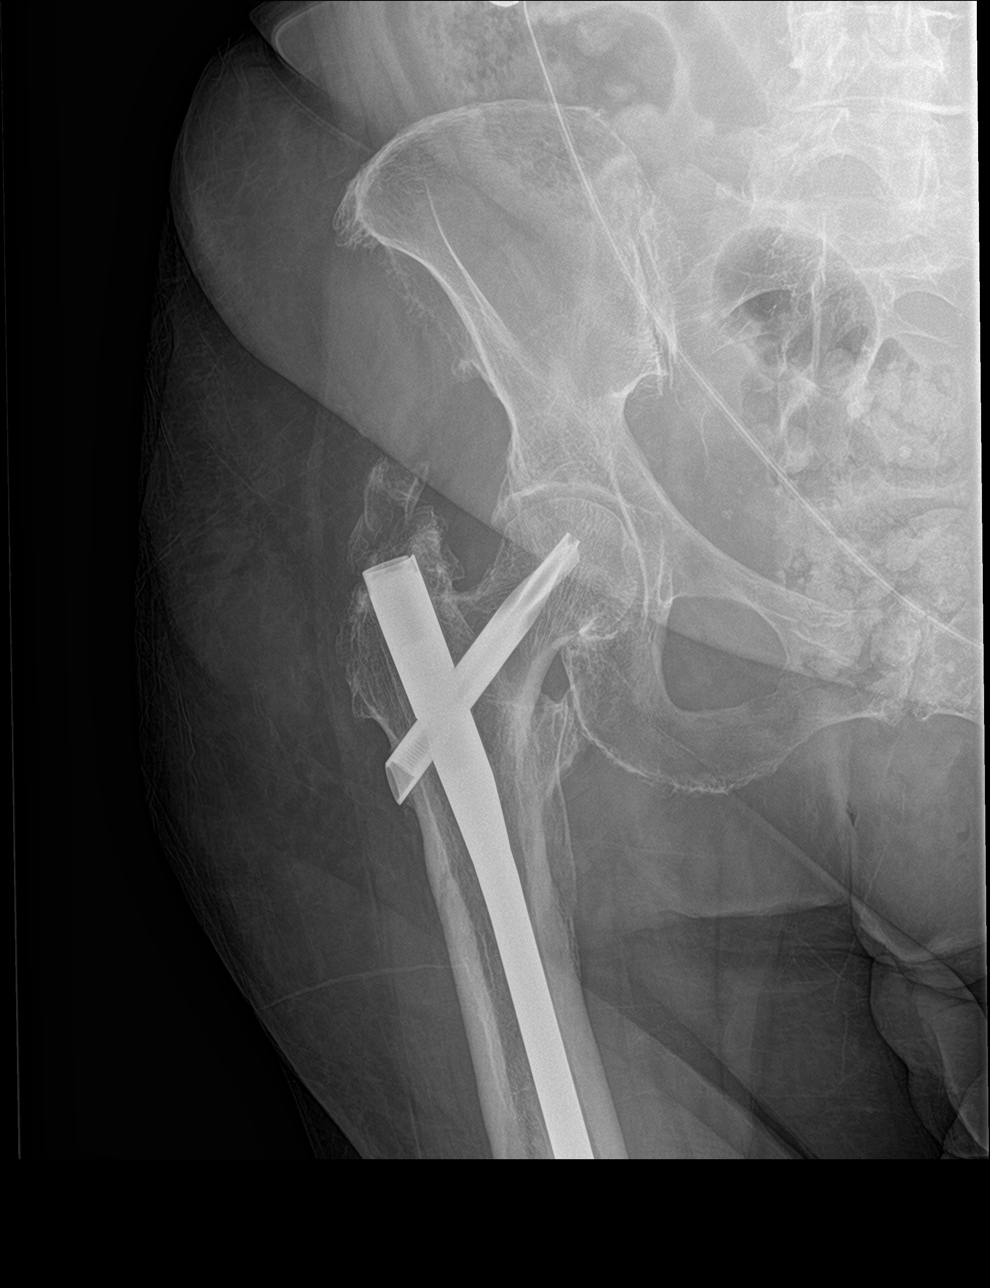
[im 2/2]
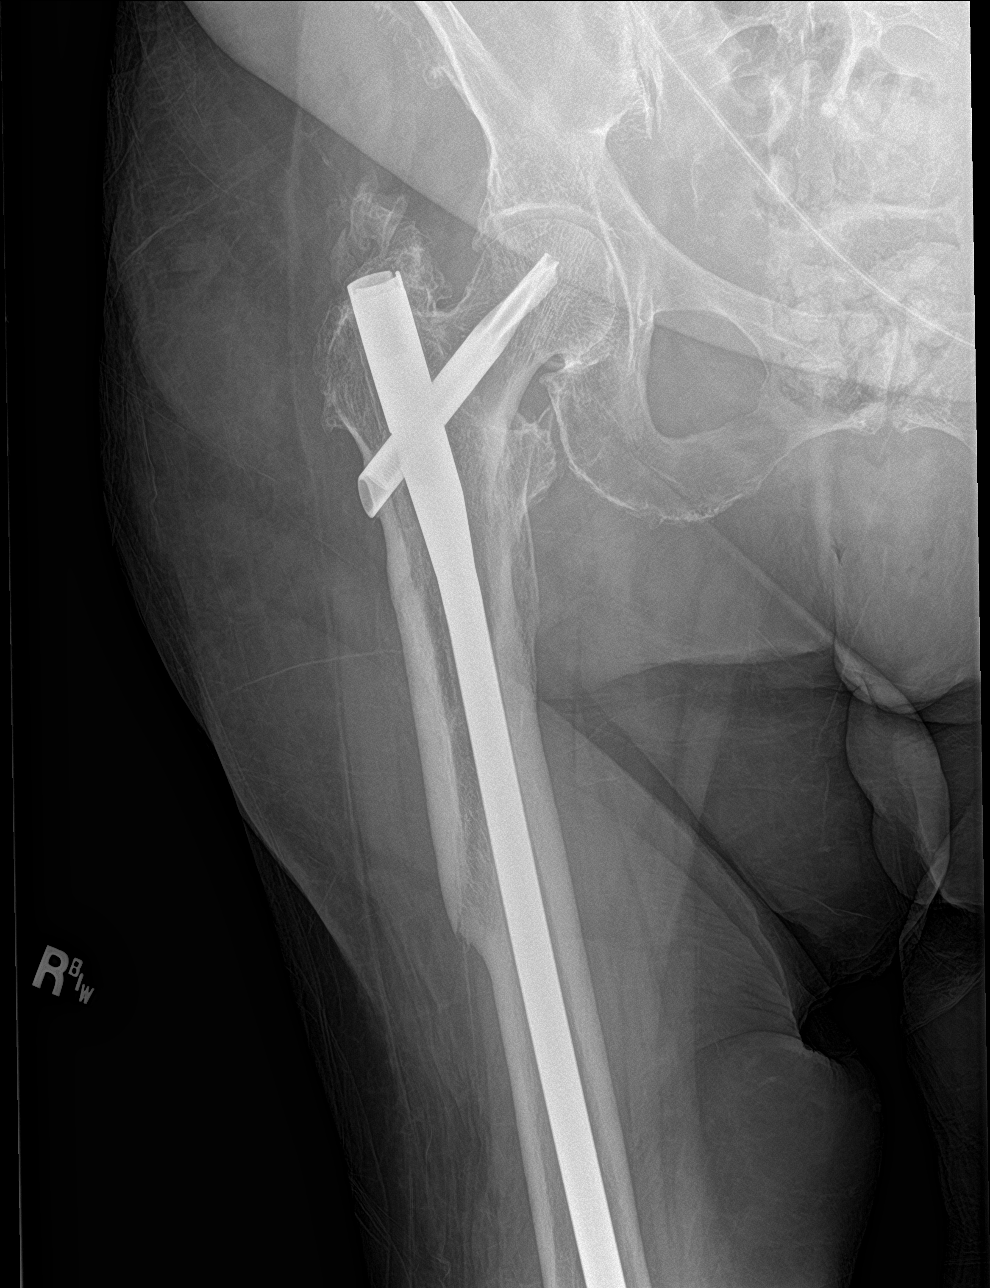

[hip lat]
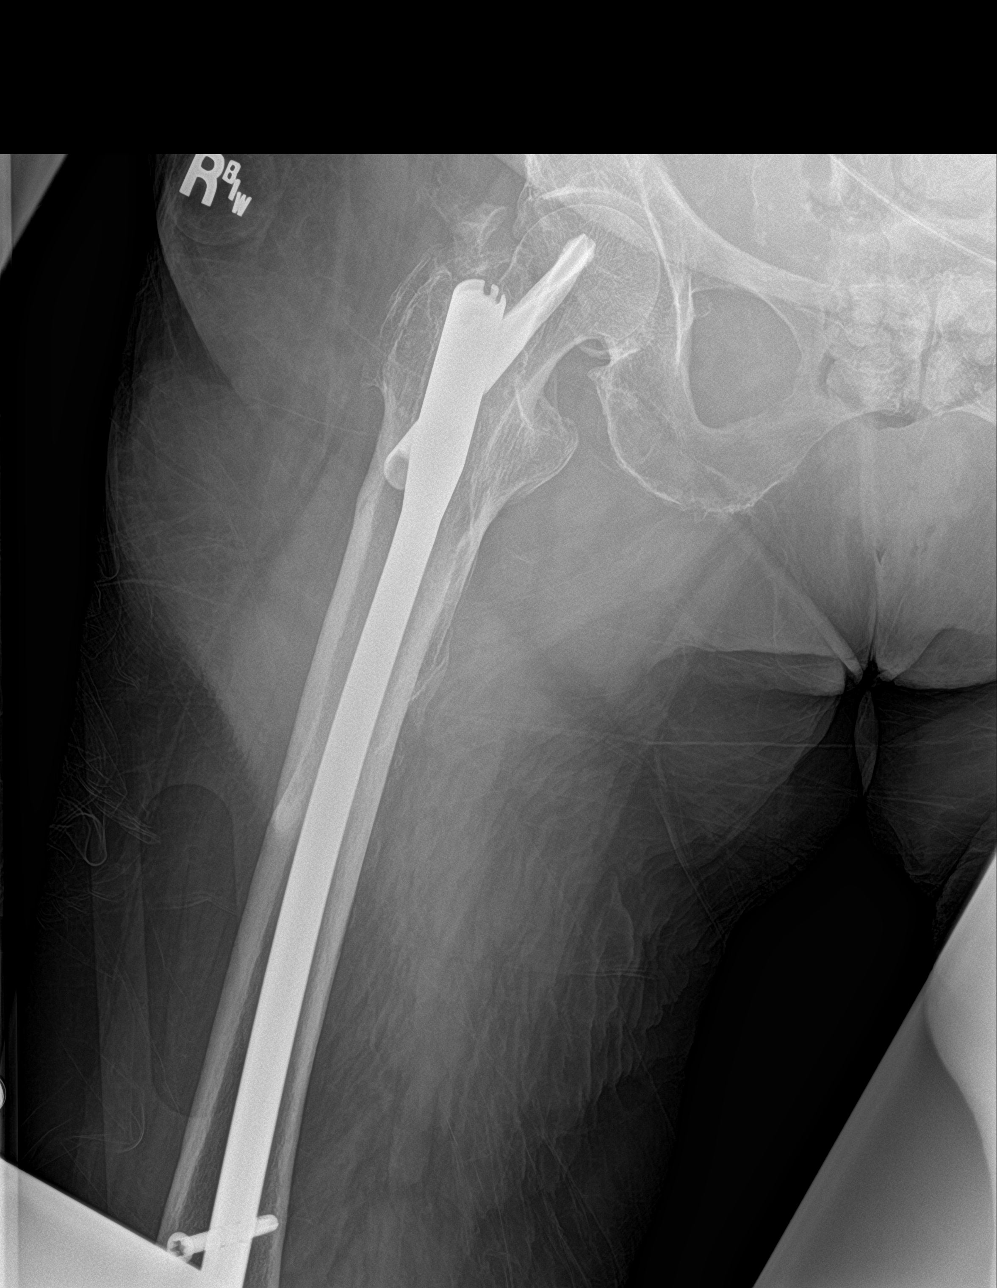

[4 of 4 positions shown; findings below may reference images not displayed]

FINDINGS: Patient has had ORIF of the right femur. There is heterotopic bone
surrounding the ORIF. Visualized portion of the right femur appears
intact. There are degenerative changes in both hips and lumbar
spine.

There is a comminuted fracture of the right symphysis pubis.
Portions of the pelvis are obscured by overlying bowel gas.
IMPRESSION: Comminuted fracture of the right symphysis pubis.

Remote ORIF of the right femur.

Degenerative changes.

## 2019-04-23 IMAGING — DX DG CHEST 2V
2 series · 2 of 2 positions shown · non-contrast
Comparison: None.

CLINICAL DATA: Arrived via EMS from patient's home. Patient fell
one week ago inside her daughter's home while walking with her
walker and had right hip pain with ecchymosis. Pt was at the home
today and patient having increased right hip pain.

EXAM:
CHEST  2 VIEW

[chest lat]
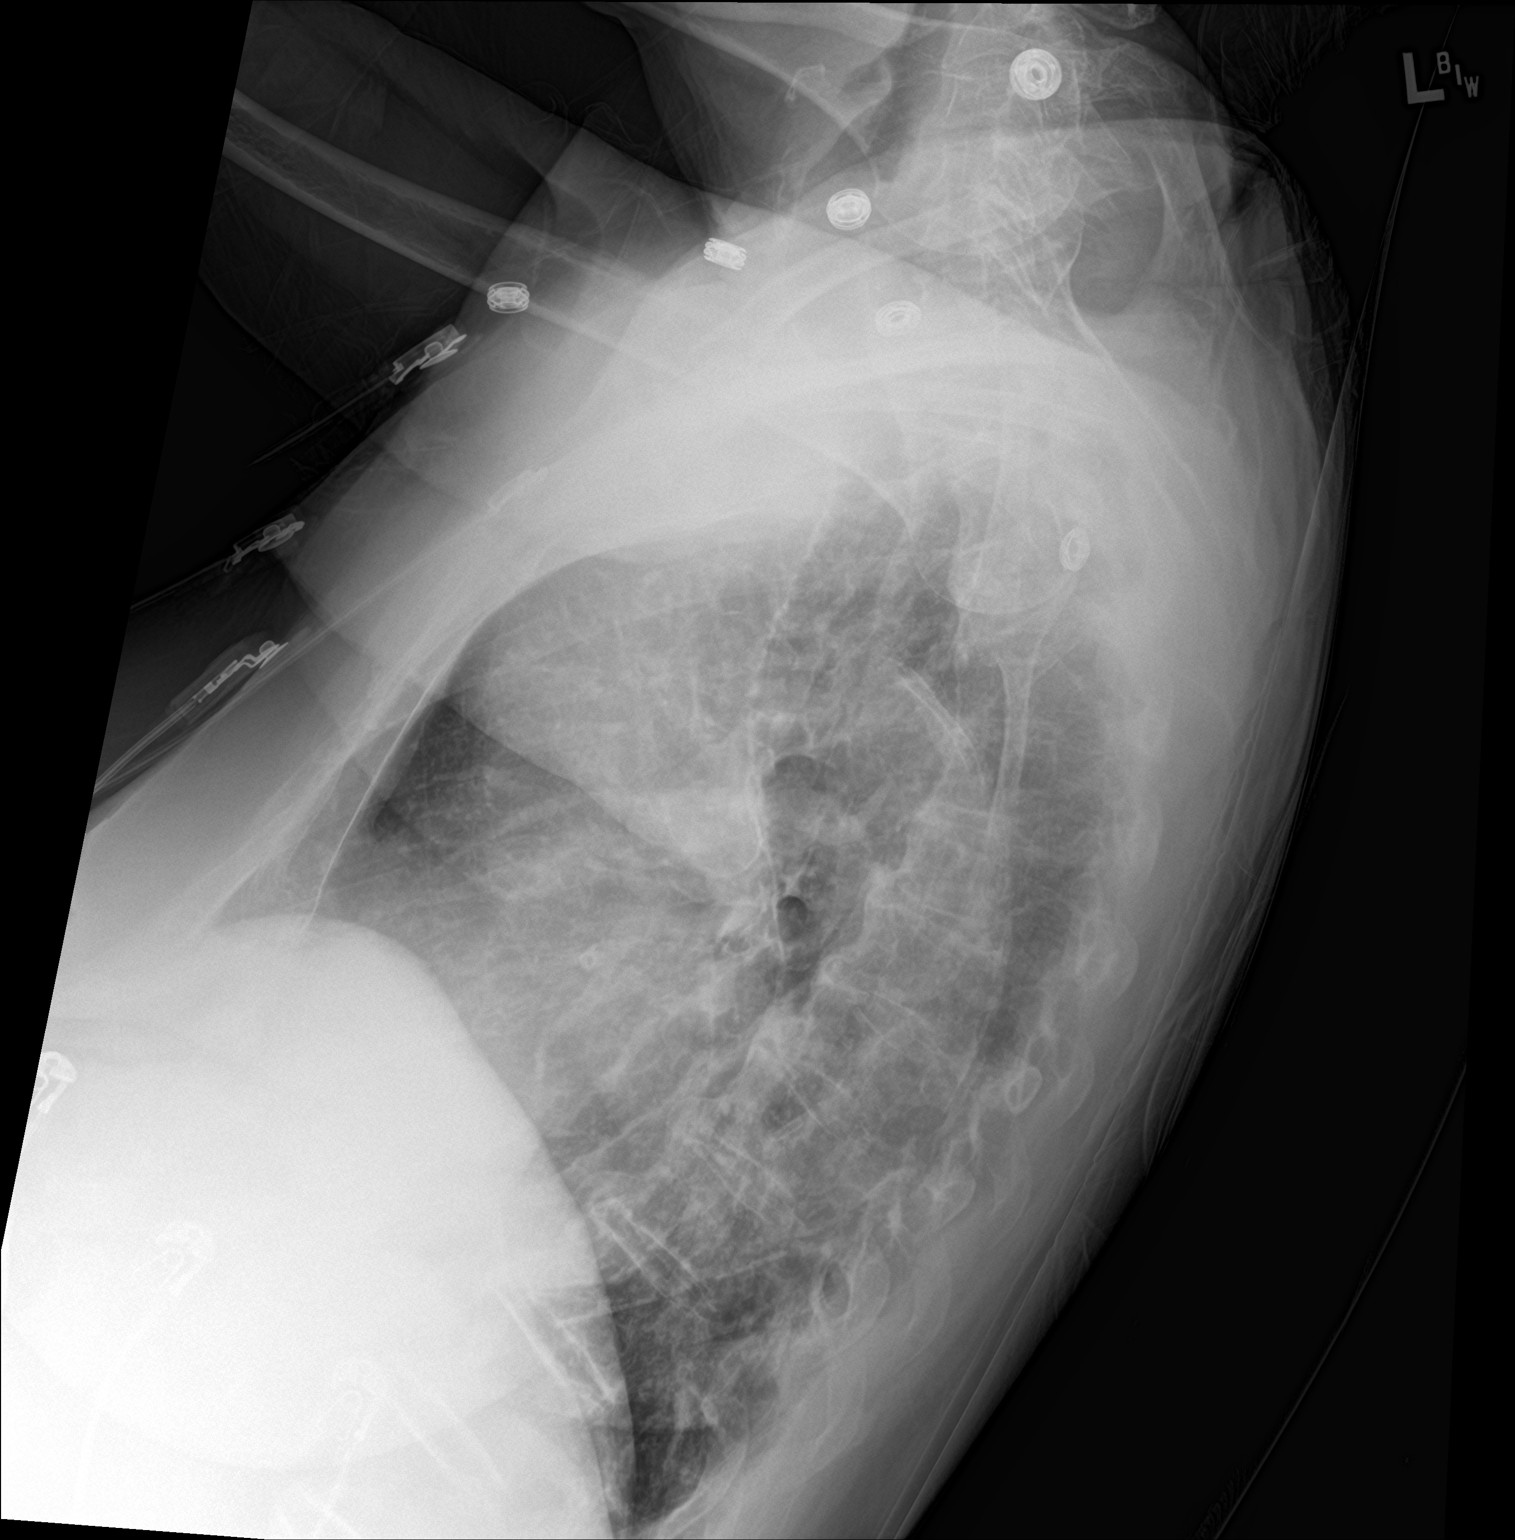

[chest ap]
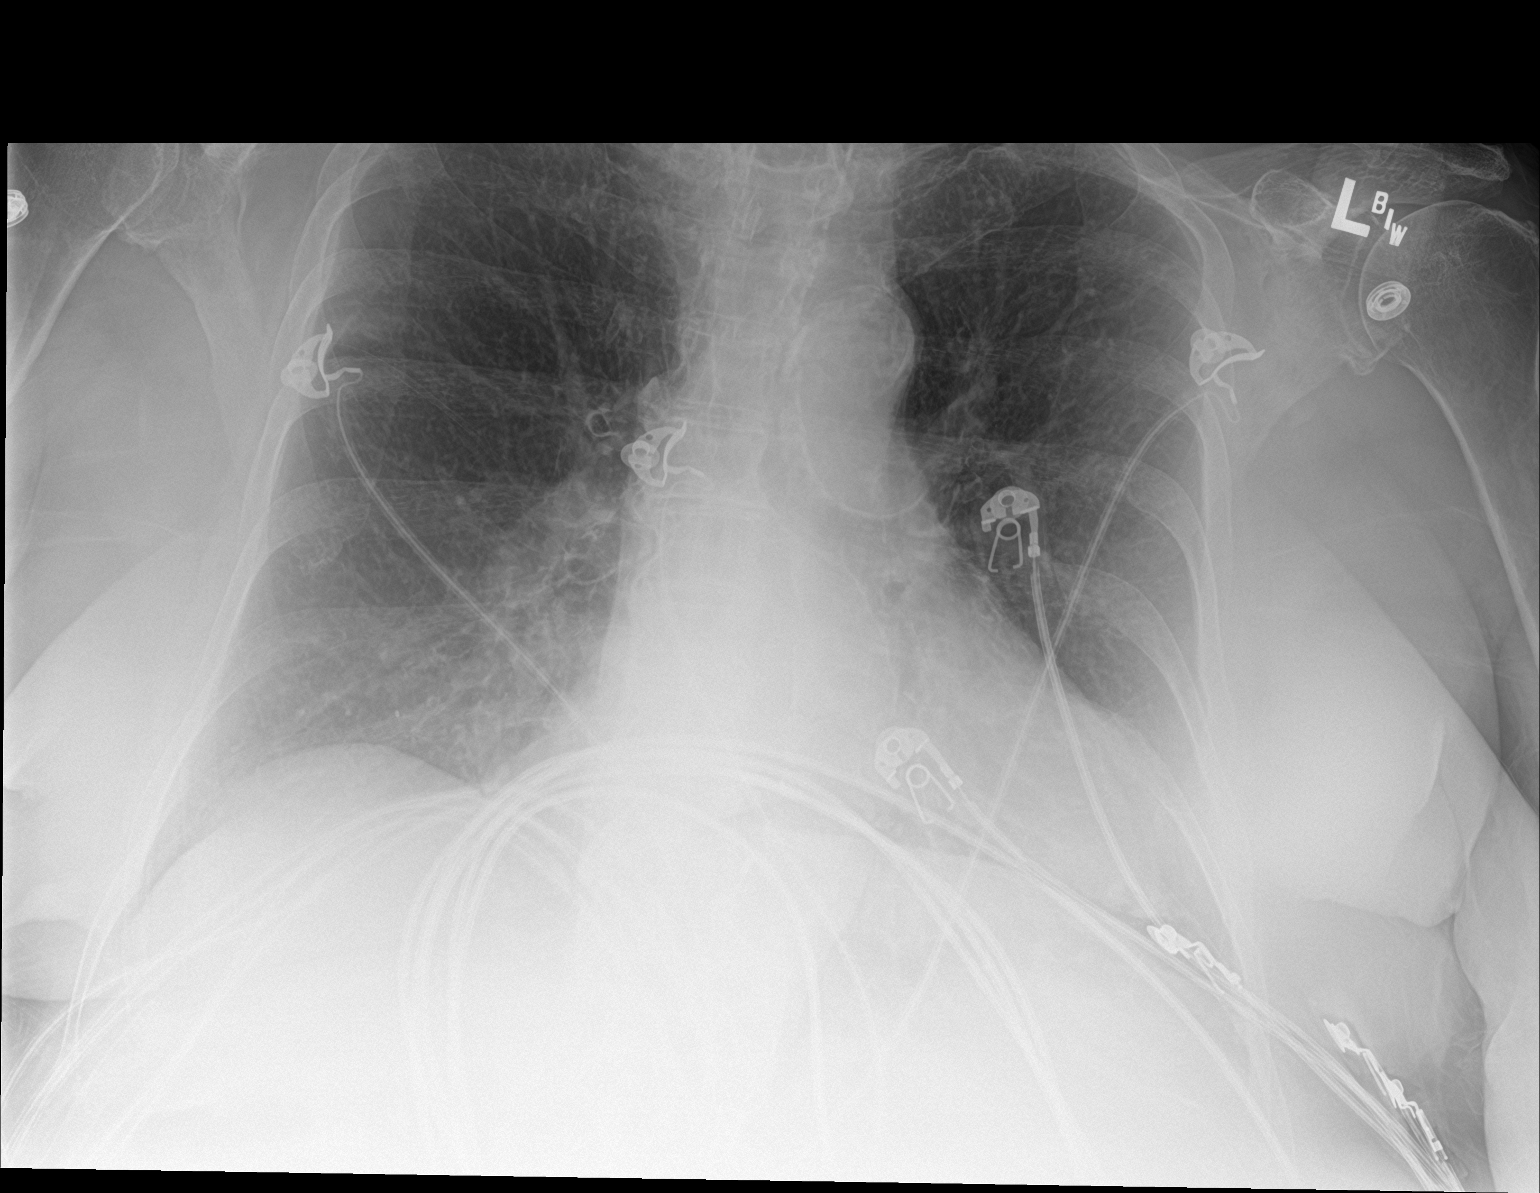

[2 of 2 positions shown; findings below may reference images not displayed]

FINDINGS: Heart is mildly enlarged. The aorta is partially calcified and
tortuous. There are no focal consolidations or pleural effusions. No
pulmonary edema. No evidence for pneumothorax or acute displaced
fractures. Chronic changes are seen in both shoulders. Degenerative
changes are seen in thoracic spine.
IMPRESSION: 1. Mild cardiomegaly.
2.  No evidence for acute pulmonary abnormality.
3.  Aortic atherosclerosis.  ([id number]) tortuous aorta.

## 2019-06-15 ENCOUNTER — Other Ambulatory Visit: Payer: Self-pay

## 2019-06-15 ENCOUNTER — Ambulatory Visit (INDEPENDENT_AMBULATORY_CARE_PROVIDER_SITE_OTHER): Payer: Medicare Other | Admitting: Podiatry

## 2019-06-15 ENCOUNTER — Encounter: Payer: Self-pay | Admitting: Podiatry

## 2019-06-15 DIAGNOSIS — L84 Corns and callosities: Secondary | ICD-10-CM

## 2019-06-15 DIAGNOSIS — M79674 Pain in right toe(s): Secondary | ICD-10-CM | POA: Diagnosis not present

## 2019-06-15 DIAGNOSIS — I739 Peripheral vascular disease, unspecified: Secondary | ICD-10-CM | POA: Diagnosis not present

## 2019-06-15 DIAGNOSIS — M79675 Pain in left toe(s): Secondary | ICD-10-CM | POA: Diagnosis not present

## 2019-06-15 DIAGNOSIS — B351 Tinea unguium: Secondary | ICD-10-CM

## 2019-06-15 NOTE — Progress Notes (Signed)
Subjective: Deborah Ball is a 86 y.o. female patient seen today on long term blood thinner for chronic painful plantar calluses right >left, chronic painful corn right 5th digit, and presents today with painful, discolored, thick toenails which interfere with daily activities.  Her daughter is present during the visit.  Patient Active Problem List   Diagnosis Date Noted  . 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 Iron deficiency anemia 07/23/2018    Current Outpatient Medications on File Prior to Visit  Medication Sig Dispense Refill  . aspirin EC 81 MG tablet Take 81 mg by mouth daily.     . cholecalciferol (VITAMIN D) 25 MCG (1000 UT) tablet Take 1,000 Units by mouth 2 (two) times a day.    . Denosumab (PROLIA [place]) Inject 1 Dose into the skin every 6 (six) months.     . diclofenac Sodium (VOLTAREN) 1 % GEL SMARTSIG:1 Sparingly Topical 3 Times Daily PRN    . diltiazem (CARTIA XT) 300 MG 24 hr capsule Take 300 mg by mouth daily.    . ferrous sulfate 325 (65 FE) MG tablet Take 325 mg by mouth every Monday, Wednesday, and Friday. At lunch    . furosemide (LASIX) 20 MG tablet Take 20 mg by mouth daily as needed (for swelling or high sodium intake--with potassium).     Marland Kitchen levothyroxine (SYNTHROID, LEVOTHROID) 50 MCG tablet Take 50 mcg by mouth daily before breakfast.     . mupirocin ointment (BACTROBAN) 2 % Place 1 application into the nose as needed (for skin wounds).     Marland Kitchen omeprazole (PRILOSEC) 20 MG capsule Take 20 mg by mouth 2 (two) times a day.    . potassium chloride (K-DUR,KLOR-CON) 10 MEQ tablet Take 10 mEq by mouth daily as needed (for swelling or high sodium intake---with lasix).     . Propylene Glycol (SYSTANE BALANCE) 0.6 % SOLN Place 1 drop into both eyes as needed (for dry eyes).     Alveda Reasons 15 MG TABS tablet Take 15 mg by mouth daily.     Current Facility-Administered Medications on File Prior to Visit  Medication Dose Route Frequency Provider Last Rate  Last Admin  . 0.9 %  sodium chloride infusion  500 mL Intravenous Once Jackquline Denmark, MD        Allergies  Allergen Reactions  . Other Anaphylaxis and Other (See Comments)    Phenbarbitol-unknown reaction, but pt had been told allergic Contrast Dye   . Statins Other (See Comments)    Joint pain.   . Sulfur Rash  . Clarithromycin Other (See Comments)    Joint pain   . Iodinated Diagnostic Agents Other (See Comments)    Tremors and shaking  . Morphine Other (See Comments)    AMS, Agitation    . Aspirin     Bruising per patient  . Ciprofloxacin Other (See Comments)    joints  . Food     NUTS AND SEEDS == DUE TO diverticulitis  DOES NOT EAT CHOCOLATE OR TAKE CAFFEINE PRODUCTS  . Penicillins     Did it involve swelling of the face/tongue/throat, SOB, or low BP? Unknown Did it involve sudden or severe rash/hives, skin peeling, or any reaction on the inside of your mouth or nose? Unknown Did you need to seek medical attention at a hospital or doctor's office? Unknown When did it last happen?unable to clarify If all above answers are "NO", may proceed with cephalosporin use.   Marland Kitchen Phenobarbital  Other reaction(s): UNKNOWN  . Sulfa Antibiotics Rash   Objective: Physical Exam  General: Davana Pradia is a pleasant 86 y.o. y.o. Caucasian female, in NAD. AAO x 3.   Vascular:  Neurovascular status unchanged b/l. Capillary fill time to digits <3 seconds b/l. Faintly palpable DP pulses b/l. Nonpalpable PT pulses b/l. Pedal hair sparse b/l. Skin temperature gradient within normal limits b/l. No pain with calf compression b/l. Varicosities present b/l.  Dermatological:  Pedal skin with normal turgor, texture and tone bilaterally. No open wounds bilaterally. No interdigital macerations bilaterally. Toenails 1-5 b/l elongated, dystrophic, thickened, crumbly with subungual debris and tenderness to dorsal palpation. Hyperkeratotic lesion(s) R 5th toe and 1st metatarsal head left  foot.  No erythema, no edema, no drainage, no flocculence. Submet head 1 hyperkeratotic lesion right foot has visible subdermal hemorrhage. No surrounding erythema, no edema, no drainage, no flocculence.  Musculoskeletal:  Normal muscle strength 5/5 to all lower extremity muscle groups bilaterally. No pain crepitus or joint limitation noted with ROM b/l. Hammertoes noted to the R 5th toe.  Neurological:  Protective sensation intact 5/5 intact bilaterally with 10g monofilament b/l. Vibratory sensation intact b/l.  Assessment and Plan:  1. Pain due to onychomycosis of toenails of both feet   2. Callus   3. PAD (peripheral artery disease) (HCC)    -Examined patient. -Toenails 1-5 b/l were debrided in length and girth with sterile nail nippers and dremel without iatrogenic bleeding.  -Corn(s) R 5th toe and callus(es) submet head 1 right foot and 1st metatarsal head left foot were pared utilizing sterile scalpel blade without incident. Total number debrided =3. -Continue Silipos toe cap to right 5th digit daily for protection. Apply every morning, remove every evening. -Patient to continue soft, supportive shoe gear daily. -Patient to report any pedal injuries to medical professional immediately. -Patient/POA to call should there be question/concern in the interim.  Return in about 9 weeks (around 08/17/2019).  Marzetta Board, DPM

## 2019-06-21 ENCOUNTER — Ambulatory Visit: Payer: Medicare Other | Admitting: Sports Medicine

## 2019-07-17 ENCOUNTER — Telehealth: Payer: Self-pay | Admitting: Gastroenterology

## 2019-07-17 NOTE — Telephone Encounter (Signed)
Melissa from [facility] called.  86yr old with upper abdo pain. Underwent CT followed by PET scan at [facility] showing new 3.7 cm x 2.3 cm hypermetabolic mass in the tail of the pancreas (Melissa will be faxing over the PET report). Nl CBC, CMP. No Jaundice or pancreatitis. Radiology recommends EUS. Pt willing to get it done.  Plan: -Would get Dr. Mansouraty's/Dr Ardis Hughs opinion. (had prev gastric EUS for gastric polyps, neg prev CT for pancreatic lesions) -She has history of A. fib and is on Xarelto.  She is getting cardiology follow-up/clearance tomorrow. -I have asked Melissa to check CA 19-9 and CEA level.  Also repeat labs.   RG    Gabe/Dan, For your opinion RG

## 2019-07-18 ENCOUNTER — Other Ambulatory Visit: Payer: Self-pay

## 2019-07-18 DIAGNOSIS — K8689 Other specified diseases of pancreas: Secondary | ICD-10-CM

## 2019-07-18 NOTE — Telephone Encounter (Signed)
I was able to view her PET images.  Clearly a mass in the tail of her pancreas.  EUS with FNA is a reasonable next step.    Patty, She needs upper EUS with GM or myself. First available appt for tail of pancreas mass.  SHe will need to hold her xarelto for 2 days prior.  Thanks

## 2019-07-18 NOTE — Telephone Encounter (Signed)
EUS scheduled with Dr Ardis Hughs on 08/24/19 at 830 am at [facility] test on 7/26 at 16 am

## 2019-07-18 NOTE — Telephone Encounter (Signed)
EUS scheduled, pt daughter  instructed and medications reviewed.  Patient instructions mailed to home and sent to the pt My Chart.  Patient daughter to call with any questions or concerns. She was given the COVID information.  She also states the pt is currently in the office with her cardiologist and will discuss the xarelto hold now.  She will contact me with any questions

## 2019-07-18 NOTE — Telephone Encounter (Signed)
Thank you for all your help RG

## 2019-07-18 NOTE — Telephone Encounter (Signed)
Agree with EUS need. GM

## 2019-07-24 DIAGNOSIS — C562 Malignant neoplasm of left ovary: Secondary | ICD-10-CM | POA: Insufficient documentation

## 2019-07-24 DIAGNOSIS — C257 Malignant neoplasm of other parts of pancreas: Secondary | ICD-10-CM | POA: Insufficient documentation

## 2019-08-17 ENCOUNTER — Ambulatory Visit: Payer: Medicare Other | Admitting: Podiatry

## 2019-08-21 ENCOUNTER — Other Ambulatory Visit (HOSPITAL_COMMUNITY)
Admission: RE | Admit: 2019-08-21 | Discharge: 2019-08-21 | Disposition: A | Discharge status: Home / Self Care | Payer: Medicare Other | Source: Ambulatory Visit | Attending: Gastroenterology | Admitting: Gastroenterology

## 2019-08-21 DIAGNOSIS — Z20822 Contact with and (suspected) exposure to covid-19: Secondary | ICD-10-CM | POA: Insufficient documentation

## 2019-08-21 DIAGNOSIS — Z01812 Encounter for preprocedural laboratory examination: Secondary | ICD-10-CM | POA: Insufficient documentation

## 2019-08-21 LAB — SARS CORONAVIRUS 2 (TAT 6-24 HRS): SARS Coronavirus 2: NEGATIVE

## 2019-08-23 ENCOUNTER — Encounter (HOSPITAL_COMMUNITY): Payer: Self-pay | Admitting: Gastroenterology

## 2019-08-23 NOTE — Anesthesia Preprocedure Evaluation (Addendum)
Anesthesia Evaluation  Patient identified by MRN, date of birth, ID band Patient awake    Reviewed: Allergy & Precautions, NPO status , Patient's Chart, lab work & pertinent test results, reviewed documented beta blocker date and time   History of Anesthesia Complications (+) PROLONGED EMERGENCE and history of anesthetic complications  Airway Mallampati: II       Dental  (+) Partial Lower, Caps,    Pulmonary pneumonia, resolved,    Pulmonary exam normal breath sounds clear to auscultation       Cardiovascular Exercise Tolerance: Poor hypertension, Pt. on medications and Pt. on home beta blockers +CHF and + DVT  + dysrhythmias Atrial Fibrillation  Rhythm:Irregular Rate:Normal     Neuro/Psych negative neurological ROS  negative psych ROS   GI/Hepatic (+) Cirrhosis       , Hx/o Hemachromatosis Pancreatic mass Diverticulosis Esophageal stricture   Endo/Other  Hypothyroidism Hx/o Right breast Ca  Renal/GU negative Renal ROSHx/o renal calculi  negative genitourinary   Musculoskeletal  (+) Arthritis , Osteoarthritis,    Abdominal   Peds  Hematology  (+) anemia , Chronic anticoagulation   Anesthesia Other Findings   Reproductive/Obstetrics                           Anesthesia Physical Anesthesia Plan  ASA: III  Anesthesia Plan: MAC   Post-op Pain Management:    Induction: Intravenous  PONV Risk Score and Plan: 2 and Ondansetron, Propofol infusion and Treatment may vary due to age or medical condition  Airway Management Planned: Natural Airway and Nasal Cannula  Additional Equipment:   Intra-op Plan:   Post-operative Plan:   Informed Consent: I have reviewed the patients History and Physical, chart, labs and discussed the procedure including the risks, benefits and alternatives for the proposed anesthesia with the patient or authorized representative who has indicated his/her  understanding and acceptance.       Plan Discussed with: CRNA and Anesthesiologist  Anesthesia Plan Comments:        Anesthesia Quick Evaluation

## 2019-08-24 ENCOUNTER — Telehealth: Payer: Self-pay

## 2019-08-24 ENCOUNTER — Ambulatory Visit (HOSPITAL_COMMUNITY)
Admission: RE | Admit: 2019-08-24 | Discharge: 2019-08-24 | Disposition: A | Discharge status: Home / Self Care | Payer: Medicare Other | Attending: Gastroenterology | Admitting: Gastroenterology

## 2019-08-24 ENCOUNTER — Encounter (HOSPITAL_COMMUNITY): Payer: Self-pay | Admitting: Gastroenterology

## 2019-08-24 ENCOUNTER — Other Ambulatory Visit: Payer: Self-pay

## 2019-08-24 ENCOUNTER — Ambulatory Visit: Payer: Medicare Other | Admitting: Podiatry

## 2019-08-24 ENCOUNTER — Encounter (HOSPITAL_COMMUNITY)
Admission: RE | Disposition: A | Discharge status: Home / Self Care | Payer: Self-pay | Source: Home / Self Care | Attending: Gastroenterology

## 2019-08-24 ENCOUNTER — Ambulatory Visit (HOSPITAL_COMMUNITY): Payer: Medicare Other | Admitting: Anesthesiology

## 2019-08-24 DIAGNOSIS — Z8719 Personal history of other diseases of the digestive system: Secondary | ICD-10-CM | POA: Diagnosis not present

## 2019-08-24 DIAGNOSIS — Z888 Allergy status to other drugs, medicaments and biological substances status: Secondary | ICD-10-CM | POA: Insufficient documentation

## 2019-08-24 DIAGNOSIS — Z886 Allergy status to analgesic agent status: Secondary | ICD-10-CM | POA: Diagnosis not present

## 2019-08-24 DIAGNOSIS — Z87442 Personal history of urinary calculi: Secondary | ICD-10-CM | POA: Diagnosis not present

## 2019-08-24 DIAGNOSIS — Z91041 Radiographic dye allergy status: Secondary | ICD-10-CM | POA: Insufficient documentation

## 2019-08-24 DIAGNOSIS — Z8379 Family history of other diseases of the digestive system: Secondary | ICD-10-CM | POA: Insufficient documentation

## 2019-08-24 DIAGNOSIS — Z885 Allergy status to narcotic agent status: Secondary | ICD-10-CM | POA: Insufficient documentation

## 2019-08-24 DIAGNOSIS — K7460 Unspecified cirrhosis of liver: Secondary | ICD-10-CM | POA: Diagnosis not present

## 2019-08-24 DIAGNOSIS — Z8371 Family history of colonic polyps: Secondary | ICD-10-CM | POA: Insufficient documentation

## 2019-08-24 DIAGNOSIS — I110 Hypertensive heart disease with heart failure: Secondary | ICD-10-CM | POA: Insufficient documentation

## 2019-08-24 DIAGNOSIS — Z880 Allergy status to penicillin: Secondary | ICD-10-CM | POA: Diagnosis not present

## 2019-08-24 DIAGNOSIS — I509 Heart failure, unspecified: Secondary | ICD-10-CM | POA: Diagnosis not present

## 2019-08-24 DIAGNOSIS — Z881 Allergy status to other antibiotic agents status: Secondary | ICD-10-CM | POA: Diagnosis not present

## 2019-08-24 DIAGNOSIS — Z853 Personal history of malignant neoplasm of breast: Secondary | ICD-10-CM | POA: Diagnosis not present

## 2019-08-24 DIAGNOSIS — K8689 Other specified diseases of pancreas: Secondary | ICD-10-CM | POA: Insufficient documentation

## 2019-08-24 DIAGNOSIS — M1990 Unspecified osteoarthritis, unspecified site: Secondary | ICD-10-CM | POA: Diagnosis not present

## 2019-08-24 DIAGNOSIS — E039 Hypothyroidism, unspecified: Secondary | ICD-10-CM | POA: Diagnosis not present

## 2019-08-24 DIAGNOSIS — E785 Hyperlipidemia, unspecified: Secondary | ICD-10-CM | POA: Diagnosis not present

## 2019-08-24 DIAGNOSIS — C252 Malignant neoplasm of tail of pancreas: Secondary | ICD-10-CM | POA: Diagnosis not present

## 2019-08-24 DIAGNOSIS — Z90711 Acquired absence of uterus with remaining cervical stump: Secondary | ICD-10-CM | POA: Diagnosis not present

## 2019-08-24 DIAGNOSIS — Z8249 Family history of ischemic heart disease and other diseases of the circulatory system: Secondary | ICD-10-CM | POA: Insufficient documentation

## 2019-08-24 DIAGNOSIS — Z800 Family history of malignant neoplasm of digestive organs: Secondary | ICD-10-CM | POA: Insufficient documentation

## 2019-08-24 DIAGNOSIS — K589 Irritable bowel syndrome without diarrhea: Secondary | ICD-10-CM | POA: Diagnosis not present

## 2019-08-24 DIAGNOSIS — Z9049 Acquired absence of other specified parts of digestive tract: Secondary | ICD-10-CM | POA: Insufficient documentation

## 2019-08-24 DIAGNOSIS — R935 Abnormal findings on diagnostic imaging of other abdominal regions, including retroperitoneum: Secondary | ICD-10-CM | POA: Insufficient documentation

## 2019-08-24 DIAGNOSIS — Z86010 Personal history of colonic polyps: Secondary | ICD-10-CM | POA: Insufficient documentation

## 2019-08-24 DIAGNOSIS — I4891 Unspecified atrial fibrillation: Secondary | ICD-10-CM | POA: Insufficient documentation

## 2019-08-24 DIAGNOSIS — Z86718 Personal history of other venous thrombosis and embolism: Secondary | ICD-10-CM | POA: Diagnosis not present

## 2019-08-24 DIAGNOSIS — Z882 Allergy status to sulfonamides status: Secondary | ICD-10-CM | POA: Diagnosis not present

## 2019-08-24 DIAGNOSIS — Z808 Family history of malignant neoplasm of other organs or systems: Secondary | ICD-10-CM | POA: Insufficient documentation

## 2019-08-24 HISTORY — PX: EUS: SHX5427

## 2019-08-24 HISTORY — PX: FINE NEEDLE ASPIRATION: SHX5430

## 2019-08-24 HISTORY — PX: ESOPHAGOGASTRODUODENOSCOPY (EGD) WITH PROPOFOL: SHX5813

## 2019-08-24 SURGERY — UPPER ENDOSCOPIC ULTRASOUND (EUS) RADIAL
Anesthesia: Monitor Anesthesia Care

## 2019-08-24 MED ORDER — PROPOFOL 500 MG/50ML IV EMUL
INTRAVENOUS | Status: DC | PRN
  Administered 2019-08-24: 150 ug/kg/min via INTRAVENOUS

## 2019-08-24 MED ORDER — SODIUM CHLORIDE 0.9 % IV SOLN: INTRAVENOUS | Status: DC

## 2019-08-24 MED ORDER — LACTATED RINGERS IV SOLN: INTRAVENOUS | Status: DC

## 2019-08-24 NOTE — Telephone Encounter (Signed)
The report has been faxed as requested  °

## 2019-08-24 NOTE — Transfer of Care (Signed)
Immediate Anesthesia Transfer of Care Note  Patient: Deborah Ball  Procedure(s) Performed: UPPER ENDOSCOPIC ULTRASOUND (EUS) RADIAL (N/A ) FINE NEEDLE ASPIRATION (FNA) LINEAR (N/A )  Patient Location: PACU  Anesthesia Type:MAC  Level of Consciousness: sedated and patient cooperative  Airway & Oxygen Therapy: Patient Spontanous Breathing and Patient connected to face mask oxygen  Post-op Assessment: Report given to RN and Post -op Vital signs reviewed and stable  Post vital signs: stable  Last Vitals:  Vitals Value Taken Time  BP 150/64 08/24/19 0937  Temp    Pulse 87 08/24/19 0939  Resp 24 08/24/19 0939  SpO2 100 % 08/24/19 0939  Vitals shown include unvalidated device data.  Last Pain:  Vitals:   08/24/19 0758  TempSrc: Oral  PainSc: 0-No pain         Complications: No complications documented.

## 2019-08-24 NOTE — H&P (Signed)
HPI: This is a 33 y woman  OSH workup [facility]) has revealed a PET avid pancreatic tail mass as well as a PET avid left ovarian process.  She was sent here for EUS evaluation and biopsy of the pancreatic tail lesion   ROS: complete GI ROS as described in HPI, all other review negative.  Constitutional:  No unintentional weight loss   Past Medical History:  Diagnosis Date  . A-fib ([place])   . Arrhythmia   . Arthritis   . Cancer ([place])    hx R breast cancer, R eye cancer-squamous cell 2018  . Chronic liver disease   . Cirrhosis of liver (HCC)    Mild per patient's daughter  . Complication of anesthesia    slow to awaken with hip surgery  . Congestive heart failure ([place])   . Diverticulitis   . Diverticulosis   . Dysrhythmia    afib  . Esophageal stricture    said she had her esophageal stretched once   . Gallstones   . Hemochromatosis   . History of blood clots    left leg, superficial thrombosis 03/21/2018 wasn't giving anything. was told how to care for it   . History of colon polyps   . History of kidney stones    calicified stone- not bothering her  . Hyperlipidemia   . Hypertension   . Hypothyroidism   . IBS (irritable bowel syndrome)   . Pneumonia    last time 2019  . Thyroid disease     Past Surgical History:  Procedure Laterality Date  . ABDOMINAL HYSTERECTOMY     partial  . APPENDECTOMY    . BIOPSY BREAST Left   . BLADDER SURGERY     x2 bladder tack  . BREAST LUMPECTOMY Right    Lmpectomy  . CARDIOVERSION     x2 2017/2018  . CATARACT EXTRACTION W/ INTRAOCULAR LENS IMPLANT Bilateral   . CHOLECYSTECTOMY    . COLONOSCOPY     around 2005-2006 age 63   . ENDOSCOPIC MUCOSAL RESECTION N/A 07/27/2018   Procedure: ENDOSCOPIC MUCOSAL RESECTION;  Surgeon: Rush Landmark Telford Nab., MD;  Location: [facility];  Service: Gastroenterology;  Laterality: N/A;  . ESOPHAGOGASTRODUODENOSCOPY     around early 2000's   . ESOPHAGOGASTRODUODENOSCOPY (EGD) WITH PROPOFOL  N/A 07/27/2018   Procedure: ESOPHAGOGASTRODUODENOSCOPY (EGD) WITH PROPOFOL;  Surgeon: Rush Landmark Telford Nab., MD;  Location: [facility];  Service: Gastroenterology;  Laterality: N/A;  . EUS N/A 07/27/2018   Procedure: UPPER ENDOSCOPIC ULTRASOUND (EUS) RADIAL;  Surgeon: Rush Landmark Telford Nab., MD;  Location: [facility];  Service: Gastroenterology;  Laterality: N/A;  . EYE SURGERY Right    skin cancer - removed.  Marland Kitchen FEMUR SURGERY Right   . HEMOSTASIS CLIP PLACEMENT  07/27/2018   Procedure: HEMOSTASIS CLIP PLACEMENT;  Surgeon: Irving Copas., MD;  Location: [facility];  Service: Gastroenterology;;  . HIP SURGERY Right    rod in place on right side   . SCLEROTHERAPY  07/27/2018   Procedure: Clide Deutscher;  Surgeon: Mansouraty, Telford Nab., MD;  Location: [facility];  Service: Gastroenterology;;  . Lia Foyer LIFTING INJECTION  07/27/2018   Procedure: SUBMUCOSAL LIFTING INJECTION;  Surgeon: Irving Copas., MD;  Location: [facility];  Service: Gastroenterology;;    No current facility-administered medications for this encounter.    Allergies as of 07/18/2019 - Review Complete 06/15/2019  Allergen Reaction Noted  . Other Anaphylaxis and Other (See Comments) 08/19/2015  . Statins Other (See Comments) 11/28/2012  . Sulfur Rash 03/02/2016  .  Clarithromycin Other (See Comments) 08/07/2014  . Iodinated diagnostic agents Other (See Comments) 11/28/2012  . Morphine Other (See Comments) 08/07/2014  . Aspirin  11/28/2012  . Ciprofloxacin Other (See Comments) 11/28/2012  . Food  09/23/2016  . Penicillins  05/07/2016  . Phenobarbital  11/28/2012  . Sulfa antibiotics Rash 11/28/2012    Family History  Problem Relation Age of Onset  . Colon cancer Child   . Skin cancer Child   . Other Child        liver transplant   . Colon polyps Child   . Liver disease Child   . Heart disease Father   . Prostate cancer Brother   . Skin cancer Daughter   . Colon polyps Daughter   .  Liver disease Maternal Aunt        died from liver failure per patient's daughter  . Other Cousin        liver transplant. On mom's side   . Colon polyps Daughter   . Esophageal cancer Neg Hx   . Inflammatory bowel disease Neg Hx   . Pancreatic cancer Neg Hx   . Stomach cancer Neg Hx     Social History   Socioeconomic History  . Marital status: Widowed    Spouse name: Not on file  . Number of children: 3  . Years of education: Not on file  . Highest education level: Not on file  Occupational History  . Not on file  Tobacco Use  . Smoking status: Never Smoker  . Smokeless tobacco: Never Used  Vaping Use  . Vaping Use: Never used  Substance and Sexual Activity  . Alcohol use: Not Currently  . Drug use: Never  . Sexual activity: Not on file  Other Topics Concern  . Not on file  Social History Narrative  . Not on file   Social Determinants of Health   Financial Resource Strain:   . Difficulty of Paying Living Expenses:   Food Insecurity:   . Worried About Charity fundraiser in the Last Year:   . Arboriculturist in the Last Year:   Transportation Needs:   . Film/video editor (Medical):   Marland Kitchen Lack of Transportation (Non-Medical):   Physical Activity:   . Days of Exercise per Week:   . Minutes of Exercise per Session:   Stress:   . Feeling of Stress :   Social Connections:   . Frequency of Communication with Friends and Family:   . Frequency of Social Gatherings with Friends and Family:   . Attends Religious Services:   . Active Member of Clubs or Organizations:   . Attends Archivist Meetings:   Marland Kitchen Marital Status:   Intimate Partner Violence:   . Fear of Current or Ex-Partner:   . Emotionally Abused:   Marland Kitchen Physically Abused:   . Sexually Abused:      Physical Exam: There were no vitals taken for this visit. Constitutional: generally well-appearing Psychiatric: alert and oriented x3 Abdomen: soft, nontender, nondistended, no obvious ascites, no  peritoneal signs, normal bowel sounds No peripheral edema noted in lower extremities  Assessment and plan: 86 y.o. female with PET avid pancreatic tail mass and PET avid left ovarian process  Here for EUS evaluation and biopsy of the pancreatic tail mass  Please see the "Patient Instructions" section for addition details about the plan.  Owens Loffler, MD [facility] 08/24/2019, 7:37 AM

## 2019-08-24 NOTE — Discharge Instructions (Signed)
[facility] Upper Endoscopy, Adult, Care After This sheet gives you information about how to care for yourself after your procedure. Your health care provider may also give you more specific instructions. If you have problems or questions, contact your health care provider. What can I expect after the procedure? After the procedure, it is common to have:  A sore throat.  Mild stomach pain or discomfort.  Bloating.  Nausea. Follow these instructions at home:   Follow instructions from your health care provider about what to eat or drink after your procedure.  Return to your normal activities as told by your health care provider. Ask your health care provider what activities are safe for you.  Take over-the-counter and prescription medicines only as told by your health care provider.  Do not drive for 24 hours if you were given a sedative during your procedure.  Keep all follow-up visits as told by your health care provider. This is important. Contact a health care provider if you have:  A sore throat that lasts longer than one day.  Trouble swallowing. Get help right away if:  You vomit blood or your vomit looks like coffee grounds.  You have: ? A fever. ? Bloody, black, or tarry stools. ? A severe sore throat or you cannot swallow. ? Difficulty breathing. ? Severe pain in your chest or abdomen. Summary  After the procedure, it is common to have a sore throat, mild stomach discomfort, bloating, and nausea.  Do not drive for 24 hours if you were given a sedative during the procedure.  Follow instructions from your health care provider about what to eat or drink after your procedure.  Return to your normal activities as told by your health care provider. This information is not intended to replace advice given to you by your health care provider. Make sure you discuss any questions you have with your health care provider. Document Revised: 07/06/2017 Document Reviewed:  06/14/2017 Elsevier Patient Education  2020 [place] After These instructions provide you with information about caring for yourself after your procedure. Your health care provider may also give you more specific instructions. Your treatment has been planned according to current medical practices, but problems sometimes occur. Call your health care provider if you have any problems or questions after your procedure. What can I expect after the procedure? After your procedure, you may:  Feel sleepy for several hours.  Feel clumsy and have poor balance for several hours.  Feel forgetful about what happened after the procedure.  Have poor judgment for several hours.  Feel nauseous or vomit.  Have a sore throat if you had a breathing tube during the procedure. Follow these instructions at home: For at least 24 hours after the procedure:      Have a responsible adult stay with you. It is important to have someone help care for you until you are awake and alert.  Rest as needed.  Do not: ? Participate in activities in which you could fall or become injured. ? Drive. ? Use heavy machinery. ? Drink alcohol. ? Take sleeping pills or medicines that cause drowsiness. ? Make important decisions or sign legal documents. ? Take care of children on your own. Eating and drinking  Follow the diet that is recommended by your health care provider.  If you vomit, drink water, juice, or soup when you can drink without vomiting.  Make sure you have little or no nausea before eating solid foods. General instructions  Take over-the-counter and prescription medicines only as told by your health care provider.  If you have sleep apnea, surgery and certain medicines can increase your risk for breathing problems. Follow instructions from your health care provider about wearing your sleep device: ? Anytime you are sleeping, including during daytime  naps. ? While taking prescription pain medicines, sleeping medicines, or medicines that make you drowsy.  If you smoke, do not smoke without supervision.  Keep all follow-up visits as told by your health care provider. This is important. Contact a health care provider if:  You keep feeling nauseous or you keep vomiting.  You feel light-headed.  You develop a rash.  You have a fever. Get help right away if:  You have trouble breathing. Summary  For several hours after your procedure, you may feel sleepy and have poor judgment.  Have a responsible adult stay with you for at least 24 hours or until you are awake and alert. This information is not intended to replace advice given to you by your health care provider. Make sure you discuss any questions you have with your health care provider. Document Revised: 04/12/2017 Document Reviewed: 05/05/2015 Elsevier Patient Education  [place].

## 2019-08-24 NOTE — Op Note (Signed)
[facility] Patient Name: Deborah Ball Procedure Date: 08/24/2019 MRN: [id number] Attending MD: Milus Banister , MD Date of Birth: [birth year] CSN: [id number] Age: 86 Admit Type: Outpatient Procedure:                Upper EUS Indications:              Abnormal abdominal PET scan and CT scan (both                            outside, done at [facility]); PET avid tail of                            pancreas and left ovarian lesions. Providers:                Milus Banister, MD, Cleda Daub, RN, Laverda Sorenson, Technician, Tyrone Apple, Technician,                            Enrigue Catena, CRNA Referring MD:             Carmell Austria, MD Medicines:                Monitored Anesthesia Care Complications:            No immediate complications. Estimated blood loss:                            None. Estimated Blood Loss:     Estimated blood loss: none. Procedure:                Pre-Anesthesia Assessment:                           - Prior to the procedure, a History and Physical                            was performed, and patient medications and                            allergies were reviewed. The patient's tolerance of                            previous anesthesia was also reviewed. The risks                            and benefits of the procedure and the sedation                            options and risks were discussed with the patient.                            All questions were answered, and informed consent  was obtained. Prior Anticoagulants: The patient has                            taken Xarelto (rivaroxaban), last dose was 3 days                            prior to procedure. ASA Grade Assessment: III - A                            patient with severe systemic disease. After                            reviewing the risks and benefits, the patient was                            deemed in satisfactory  condition to undergo the                            procedure.                           After obtaining informed consent, the endoscope was                            passed under direct vision. Throughout the                            procedure, the patient's blood pressure, pulse, and                            oxygen saturations were monitored continuously. The                            [id number] ([id number]) Olympus Linear EUS was                            introduced through the mouth, and advanced to the                            second part of duodenum. The upper EUS was                            accomplished without difficulty. The patient                            tolerated the procedure well. Scope In: Scope Out: Findings:      Endoscopic findings (limited views with linear echoendoscope)      1. Normal UGI tract.      ENDOSONOGRAPHIC FINDING: :      1. A very irregularly shaped, heterogeneous, mixed cystic/solid mass was       noted in the tail of the pancreas. The mass measured 3.3cm by 3.7cm. The       endosonographic borders were very poorly-defined. Fine needle aspiration       for cytology was  performed. Three passes were made with the 22 gauge       needle using a transgastric approach. Final cytology results are pending.      2. The pancreatic parenchyma was otherwise atrophic.      3. No peripancreatic adenopathy.      4. CBD was normal, non-dilated.      5. Limited views of the liver, spleen were all normal.      6. Gallbladder surgically absent. Impression:               - A very irregularly shaped, heterogeneous, mixed                            cystic/solid mass 3.3cm by 3.7cm mass in the tail                            of the pancreas. Preliminary cytology shows                            atypical, neoplastic cells. Await final cytology                            report.                           - It is unclear if the PET avid pancreatic tail                             lesion is related to the intensely PET avid left                            ovarian lesion. Moderate Sedation:      Not Applicable - Patient had care per Anesthesia. Recommendation:           - Discharge patient to home.                           - OK to resume your blood thinner tomorrow. Procedure Code(s):        --- Professional ---                           [phone], Esophagogastroduodenoscopy, flexible,                            transoral; with transendoscopic ultrasound-guided                            intramural or transmural fine needle                            aspiration/biopsy(s), (includes endoscopic                            ultrasound examination limited to the esophagus,                            stomach or duodenum, and adjacent structures) Diagnosis  Code(s):        --- Professional ---                           K86.89, Other specified diseases of pancreas                           R93.5, Abnormal findings on diagnostic imaging of                            other abdominal regions, including retroperitoneum CPT copyright 2019 [employer]. All rights reserved. The codes documented in this report are preliminary and upon coder review may  be revised to meet current compliance requirements. Milus Banister, MD 08/24/2019 9:35:56 AM This report has been signed electronically. Number of Addenda: 0

## 2019-08-24 NOTE — Telephone Encounter (Signed)
-----   Message from Milus Banister, MD sent at 08/24/2019  9:37 AM EDT ----- Merrie Roof,  Just completed EUS. See full report in epic.  By preliminary reading this tail of pancreas lesion is clearly a neoplastic growth.  Await final path results. The PET avid ovarian lesion will probably need further investigation.   Angelyse Heslin, Please send a copy of today's EUS report to Dr. Gilford Rile and also to the [facility] (not sure who she's been seeing there).   thanks     - A very irregularly shaped, heterogeneous, mixed cystic/solid mass 3.3cm by 3.7cm mass in the tail of the pancreas.  Preliminary cytology shows atypical, neoplastic cells. Await final cytology report. - It is unclear if the PET avid pancreatic tail lesion is related to the intensely PET avid left ovarian lesion.

## 2019-08-24 NOTE — Anesthesia Postprocedure Evaluation (Signed)
Anesthesia Post Note  Patient: Baylie Drakes  Procedure(s) Performed: UPPER ENDOSCOPIC ULTRASOUND (EUS) RADIAL (N/A ) FINE NEEDLE ASPIRATION (FNA) LINEAR (N/A )     Patient location during evaluation: PACU Anesthesia Type: MAC Level of consciousness: awake and alert and oriented Pain management: pain level controlled Vital Signs Assessment: post-procedure vital signs reviewed and stable Respiratory status: spontaneous breathing, nonlabored ventilation and respiratory function stable Cardiovascular status: stable and blood pressure returned to baseline Postop Assessment: no apparent nausea or vomiting Anesthetic complications: no   No complications documented.  Last Vitals:  Vitals:   08/24/19 0950 08/24/19 1000  BP: 103/78 (!) 151/71  Pulse: 83 88  Resp: 16 17  Temp:    SpO2: 100% 95%    Last Pain:  Vitals:   08/24/19 1000  TempSrc:   PainSc: 0-No pain                 Charlotta Lapaglia A.

## 2019-08-24 NOTE — Anesthesia Procedure Notes (Addendum)
Procedure Name: MAC Date/Time: 08/24/2019 8:38 AM Performed by: Lissa Morales, CRNA Pre-anesthesia Checklist: Patient identified, Emergency Drugs available, Suction available, Patient being monitored and Timeout performed Patient Re-evaluated:Patient Re-evaluated prior to induction Oxygen Delivery Method: Simple face mask Placement Confirmation: positive ETCO2

## 2019-08-25 ENCOUNTER — Encounter (HOSPITAL_COMMUNITY): Payer: Self-pay | Admitting: Gastroenterology

## 2019-08-26 ENCOUNTER — Emergency Department (HOSPITAL_COMMUNITY)
Admission: EM | Admit: 2019-08-26 | Discharge: 2019-08-26 | Disposition: A | Discharge status: Home / Self Care | Payer: Medicare Other | Attending: Emergency Medicine | Admitting: Emergency Medicine

## 2019-08-26 ENCOUNTER — Emergency Department (HOSPITAL_COMMUNITY): Payer: Medicare Other

## 2019-08-26 ENCOUNTER — Telehealth: Payer: Self-pay | Admitting: Gastroenterology

## 2019-08-26 ENCOUNTER — Other Ambulatory Visit: Payer: Self-pay

## 2019-08-26 DIAGNOSIS — I509 Heart failure, unspecified: Secondary | ICD-10-CM | POA: Insufficient documentation

## 2019-08-26 DIAGNOSIS — Z79899 Other long term (current) drug therapy: Secondary | ICD-10-CM | POA: Insufficient documentation

## 2019-08-26 DIAGNOSIS — I110 Hypertensive heart disease with heart failure: Secondary | ICD-10-CM | POA: Insufficient documentation

## 2019-08-26 DIAGNOSIS — Y9389 Activity, other specified: Secondary | ICD-10-CM | POA: Insufficient documentation

## 2019-08-26 DIAGNOSIS — R21 Rash and other nonspecific skin eruption: Secondary | ICD-10-CM | POA: Insufficient documentation

## 2019-08-26 DIAGNOSIS — M7981 Nontraumatic hematoma of soft tissue: Secondary | ICD-10-CM | POA: Diagnosis present

## 2019-08-26 DIAGNOSIS — Y9289 Other specified places as the place of occurrence of the external cause: Secondary | ICD-10-CM | POA: Insufficient documentation

## 2019-08-26 DIAGNOSIS — E039 Hypothyroidism, unspecified: Secondary | ICD-10-CM | POA: Diagnosis not present

## 2019-08-26 DIAGNOSIS — Y998 Other external cause status: Secondary | ICD-10-CM | POA: Insufficient documentation

## 2019-08-26 DIAGNOSIS — X58XXXA Exposure to other specified factors, initial encounter: Secondary | ICD-10-CM | POA: Diagnosis not present

## 2019-08-26 DIAGNOSIS — S301XXA Contusion of abdominal wall, initial encounter: Secondary | ICD-10-CM | POA: Insufficient documentation

## 2019-08-26 DIAGNOSIS — Z853 Personal history of malignant neoplasm of breast: Secondary | ICD-10-CM | POA: Diagnosis not present

## 2019-08-26 LAB — COMPREHENSIVE METABOLIC PANEL
ALT: 13 U/L (ref 0–44)
AST: 19 U/L (ref 15–41)
Albumin: 3.4 g/dL — ABNORMAL LOW (ref 3.5–5.0)
Alkaline Phosphatase: 81 U/L (ref 38–126)
Anion gap: 11 (ref 5–15)
BUN: 10 mg/dL (ref 8–23)
CO2: 25 mmol/L (ref 22–32)
Calcium: 9.2 mg/dL (ref 8.9–10.3)
Chloride: 107 mmol/L (ref 98–111)
Creatinine, Ser: 0.67 mg/dL (ref 0.44–1.00)
GFR calc Af Amer: >60 mL/min (ref >60)
GFR calc non Af Amer: >60 mL/min (ref >60)
Glucose, Bld: 88 mg/dL (ref 70–99)
Potassium: 4 mmol/L (ref 3.5–5.1)
Sodium: 143 mmol/L (ref 135–145)
Total Bilirubin: 0.9 mg/dL (ref 0.3–1.2)
Total Protein: 7.7 g/dL (ref 6.5–8.1)

## 2019-08-26 LAB — PROTIME-INR
INR: 1.8 — ABNORMAL HIGH (ref 0.8–1.2)
Prothrombin Time: 20.3 seconds — ABNORMAL HIGH (ref 11.4–15.2)

## 2019-08-26 LAB — CBC WITH DIFFERENTIAL/PLATELET
Abs Immature Granulocytes: 0.03 10*3/uL (ref 0.00–0.07)
Basophils Absolute: 0.1 10*3/uL (ref 0.0–0.1)
Basophils Relative: 1 %
Eosinophils Absolute: 0.1 10*3/uL (ref 0.0–0.5)
Eosinophils Relative: 1 %
HCT: 43.3 % (ref 36.0–46.0)
Hemoglobin: 13.4 g/dL (ref 12.0–15.0)
Immature Granulocytes: 0 %
Lymphocytes Relative: 32 %
Lymphs Abs: 2.8 10*3/uL (ref 0.7–4.0)
MCH: 31.9 pg (ref 26.0–34.0)
MCHC: 30.9 g/dL (ref 30.0–36.0)
MCV: 103.1 fL — ABNORMAL HIGH (ref 80.0–100.0)
Monocytes Absolute: 0.8 10*3/uL (ref 0.1–1.0)
Monocytes Relative: 10 %
Neutro Abs: 4.9 10*3/uL (ref 1.7–7.7)
Neutrophils Relative %: 56 %
Platelets: 279 10*3/uL (ref 150–400)
RBC: 4.2 MIL/uL (ref 3.87–5.11)
RDW: 15.1 % (ref 11.5–15.5)
WBC: 8.8 10*3/uL (ref 4.0–10.5)
nRBC: 0 % (ref 0.0–0.2)

## 2019-08-26 MED ORDER — IOHEXOL 9 MG/ML PO SOLN
ORAL | Status: AC
  Filled 2019-08-26: qty 500

## 2019-08-26 NOTE — Telephone Encounter (Signed)
I call the patient's daughter back.  After further thought I was concerned that this bruising could be Cullen's sign, representing RP bleed.  I explained that due to her age I think it would be best that she come to the ED for evaluation to rule that out.  Check CBC, non-contrast CT, etc.  Daughter understands and will bring her mother to [facility] ED today.

## 2019-08-26 NOTE — ED Provider Notes (Signed)
[facility] DEPT Provider Note   CSN: [id number] Arrival date & time: 08/26/19  1155     History No chief complaint on file.   Shelvia Fojtik is a 86 y.o. female.  HPI    86 y/o with recently diagnosed pancreatic tumor, AF, liver cirrhosis on Xarelto comes in with cc of bruising. Patient had endoscopic [place] with biopsy of the pancreas 2 days ago. Pt woke up with bruising to her abd. No abd pain. Called her GI doctor - who advised to come to the ER and get CT to r/o bleeding.   Past Medical History:  Diagnosis Date  . A-fib ([place])   . Arrhythmia   . Arthritis   . Cancer ([place])    hx R breast cancer, R eye cancer-squamous cell 2018  . Chronic liver disease   . Cirrhosis of liver ([facility])    Mild per patient's daughter  . Complication of anesthesia    slow to awaken with hip surgery  . Congestive heart failure ([place])   . Diverticulitis   . Diverticulosis   . Dysrhythmia    afib  . Esophageal stricture    said she had her esophageal stretched once   . Gallstones   . Hemochromatosis   . History of blood clots    left leg, superficial thrombosis 03/21/2018 wasn't giving anything. was told how to care for it   . History of colon polyps   . History of kidney stones    calicified stone- not bothering her  . Hyperlipidemia   . Hypertension   . Hypothyroidism   . IBS (irritable bowel syndrome)   . Pneumonia    last time 2019  . Thyroid disease     Patient Active Problem List   Diagnosis Date Noted  . 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 Iron deficiency anemia 07/23/2018    Past Surgical History:  Procedure Laterality Date  . ABDOMINAL HYSTERECTOMY     partial  . APPENDECTOMY    . BIOPSY BREAST Left   . BLADDER SURGERY     x2 bladder tack  . BREAST LUMPECTOMY Right    Lmpectomy  . CARDIOVERSION     x2 2017/2018  . CATARACT EXTRACTION W/ INTRAOCULAR LENS IMPLANT Bilateral   . CHOLECYSTECTOMY    . COLONOSCOPY      around 2005-2006 age 25   . ENDOSCOPIC MUCOSAL RESECTION N/A 07/27/2018   Procedure: ENDOSCOPIC MUCOSAL RESECTION;  Surgeon: Rush Landmark Telford Nab., MD;  Location: [place];  Service: Gastroenterology;  Laterality: N/A;  . ESOPHAGOGASTRODUODENOSCOPY     around early 2000's   . ESOPHAGOGASTRODUODENOSCOPY (EGD) WITH PROPOFOL N/A 07/27/2018   Procedure: ESOPHAGOGASTRODUODENOSCOPY (EGD) WITH PROPOFOL;  Surgeon: Rush Landmark Telford Nab., MD;  Location: [place];  Service: Gastroenterology;  Laterality: N/A;  . ESOPHAGOGASTRODUODENOSCOPY (EGD) WITH PROPOFOL N/A 08/24/2019   Procedure: ESOPHAGOGASTRODUODENOSCOPY (EGD) WITH PROPOFOL;  Surgeon: Milus Banister, MD;  Location: [facility];  Service: Endoscopy;  Laterality: N/A;  . EUS N/A 07/27/2018   Procedure: UPPER ENDOSCOPIC ULTRASOUND (EUS) RADIAL;  Surgeon: Rush Landmark Telford Nab., MD;  Location: [place];  Service: Gastroenterology;  Laterality: N/A;  . EUS N/A 08/24/2019   Procedure: UPPER ENDOSCOPIC ULTRASOUND (EUS) RADIAL;  Surgeon: Milus Banister, MD;  Location: [facility];  Service: Endoscopy;  Laterality: N/A;  . EYE SURGERY Right    skin cancer - removed.  Marland Kitchen FEMUR SURGERY Right   . FINE NEEDLE ASPIRATION N/A 08/24/2019   Procedure: FINE NEEDLE ASPIRATION (  FNA) LINEAR;  Surgeon: Milus Banister, MD;  Location: [facility];  Service: Endoscopy;  Laterality: N/A;  . HEMOSTASIS CLIP PLACEMENT  07/27/2018   Procedure: HEMOSTASIS CLIP PLACEMENT;  Surgeon: Irving Copas., MD;  Location: [place];  Service: Gastroenterology;;  . HIP SURGERY Right    rod in place on right side   . SCLEROTHERAPY  07/27/2018   Procedure: Clide Deutscher;  Surgeon: Mansouraty, Telford Nab., MD;  Location: [place];  Service: Gastroenterology;;  . Lia Foyer LIFTING INJECTION  07/27/2018   Procedure: SUBMUCOSAL LIFTING INJECTION;  Surgeon: Irving Copas., MD;  Location: [place];  Service: Gastroenterology;;     OB History   No  obstetric history on file.     Family History  Problem Relation Age of Onset  . Colon cancer Child   . Skin cancer Child   . Other Child        liver transplant   . Colon polyps Child   . Liver disease Child   . Heart disease Father   . Prostate cancer Brother   . Skin cancer Daughter   . Colon polyps Daughter   . Liver disease Maternal Aunt        died from liver failure per patient's daughter  . Other Cousin        liver transplant. On mom's side   . Colon polyps Daughter   . Esophageal cancer Neg Hx   . Inflammatory bowel disease Neg Hx   . Pancreatic cancer Neg Hx   . Stomach cancer Neg Hx     Social History   Tobacco Use  . Smoking status: Never Smoker  . Smokeless tobacco: Never Used  Vaping Use  . Vaping Use: Never used  Substance Use Topics  . Alcohol use: Not Currently  . Drug use: Never    Home Medications Prior to Admission medications   Medication Sig Start Date End Date Taking? Authorizing Provider  bisacodyl (DULCOLAX) 5 MG EC tablet Take 5 mg by mouth daily as needed for moderate constipation.   Yes [provider]  cholecalciferol (VITAMIN D) 25 MCG (1000 UT) tablet Take 1,000 Units by mouth 2 (two) times a day.   Yes [provider]  diclofenac Sodium (VOLTAREN) 1 % GEL Apply 2 g topically daily as needed (Joint pain).  02/28/19  Yes [provider]  diltiazem (CARTIA XT) 300 MG 24 hr capsule Take 300 mg by mouth daily.   Yes [provider]  ferrous sulfate 325 (65 FE) MG tablet Take 325 mg by mouth every Monday, Wednesday, and Friday. At lunch   Yes [provider]  furosemide (LASIX) 20 MG tablet Take 20 mg by mouth daily as needed (for swelling or high sodium intake--with potassium).    Yes [provider]  levothyroxine (SYNTHROID, LEVOTHROID) 50 MCG tablet Take 50 mcg by mouth daily before breakfast.  03/24/10  Yes [provider]  mupirocin ointment (BACTROBAN) 2 % Place 1 application  into the nose daily as needed (for skin wounds).    Yes [provider]  omeprazole (PRILOSEC) 20 MG capsule Take 20 mg by mouth 2 (two) times a day.   Yes [provider]  potassium chloride (K-DUR,KLOR-CON) 10 MEQ tablet Take 10 mEq by mouth daily as needed (for swelling or high sodium intake---with lasix).    Yes [provider]  Propylene Glycol (SYSTANE BALANCE) 0.6 % SOLN Place 1 drop into both eyes 2 (two) times daily as needed (for dry  eyes).    Yes [provider]  vitamin B-12 (CYANOCOBALAMIN) 1000 MCG tablet Take 1,000 mcg by mouth daily.   Yes [provider]  XARELTO 15 MG TABS tablet Take 15 mg by mouth at bedtime.  03/20/19  Yes [provider]  Denosumab (PROLIA Trooper) Inject 1 Dose into the skin every 6 (six) months.     [provider]    Allergies    Other, Statins, Sulfur, Clarithromycin, Iodinated diagnostic agents, Morphine, Aspirin, Ciprofloxacin, Food, Penicillins, Phenobarbital, and Sulfa antibiotics  Review of Systems   Review of Systems  Constitutional: Negative for activity change.  Gastrointestinal: Negative for abdominal pain.  Skin: Positive for rash.  Hematological: Bruises/bleeds easily.  All other systems reviewed and are negative.   Physical Exam Updated Vital Signs BP (!) 148/75 (BP Location: Right Arm)   Pulse 83   Temp 97.8 F (36.6 C) (Oral)   Resp 20   Ht 5\' 4"  (1.626 m)   Wt 70.3 kg   SpO2 97%   BMI 26.61 kg/m   Physical Exam Vitals and nursing note reviewed.  Constitutional:      Appearance: She is well-developed.  HENT:     Head: Normocephalic and atraumatic.  Eyes:     Pupils: Pupils are equal, round, and reactive to light.  Cardiovascular:     Rate and Rhythm: Normal rate and regular rhythm.     Heart sounds: Normal heart sounds.  Pulmonary:     Effort: Pulmonary effort is normal.  Abdominal:     Palpations: Abdomen is soft.     Tenderness: There is no abdominal  tenderness.  Musculoskeletal:     Cervical back: Neck supple.  Skin:    General: Skin is warm and dry.     Findings: Bruising present.  Neurological:     Mental Status: She is alert and oriented to person, place, and time.     ED Results / Procedures / Treatments   Labs (all labs ordered are listed, but only abnormal results are displayed) Labs Reviewed  CBC WITH DIFFERENTIAL/PLATELET - Abnormal; Notable for the following components:      Result Value   MCV 103.1 (*)    All other components within normal limits  PROTIME-INR - Abnormal; Notable for the following components:   Prothrombin Time 20.3 (*)    INR 1.8 (*)    All other components within normal limits  COMPREHENSIVE METABOLIC PANEL - Abnormal; Notable for the following components:   Albumin 3.4 (*)    All other components within normal limits    EKG None  Radiology CT ABDOMEN PELVIS WO CONTRAST  Result Date: 08/26/2019 CLINICAL DATA:  Concern for retroperitoneal bleed. Bruising. Patient underwent recent EUS. EXAM: CT ABDOMEN AND PELVIS WITHOUT CONTRAST TECHNIQUE: Multidetector CT imaging of the abdomen and pelvis was performed following the standard protocol without IV contrast. COMPARISON:  Noncontrast CT 06/28/2019 at [place]: Lower chest: Cardiomegaly with coronary artery calcifications. No pleural fluid. Mild hypoventilatory atelectasis. Hepatobiliary: Stable cyst in the left lobe of the liver. Coarse calcification adjacent to the falciform ligament. Small subcentimeter old defined low densities in the right lobe, series 2, image 27, unchanged from prior and nonspecific. Postcholecystectomy with stable biliary tree. Pancreas: Mass in the pancreatic tail measures 3.7 x 2.7 cm, not significantly changed from prior exam. No peripancreatic fat stranding. There is no ductal dilatation. Spleen: Normal in size without focal abnormality. Adrenals/Urinary Tract: No adrenal nodule. There is mild left  hydronephrosis and  hydroureter to the level of left adnexal mass, new from prior exam. Multiple low-density and isodense lesions within the left kidney are unchanged from prior exam. There is no right hydronephrosis. Small cyst in the right kidney again seen. Urinary bladder is partially distended. Small focus of air in the bladder is likely related to prior catheter placement. Stomach/Bowel: Small hiatal hernia. Stomach is unremarkable. Normal appearance of the duodenum. No small bowel obstruction or inflammation. Appendectomy. Small to moderate volume of stool throughout the colon. Diverticulosis involving the distal descending and sigmoid colon. Sigmoid colon is intimately related to the enlarged left ovary/adnexal mass. No evidence of acute diverticulitis or colonic inflammation. Vascular/Lymphatic: Aortic atherosclerosis without aneurysm. No evidence of retroperitoneal hemorrhage or bleed. Shotty lower retroperitoneal lymph nodes within the levin mm right pericaval node, series 2, image 45. Multiple additional small retroperitoneal lymph nodes are nonspecific. No obvious peripancreatic adenopathy. Reproductive: Post hysterectomy. Partially calcified left adnexal mass measuring 5.5 x 3.9 cm, not significantly changed in size from prior exam, however likely not causing obstruction of the left ureter. Lobulated fluid attenuation lesion in the right ovary is unchanged measuring 2.5 x 2.2 cm, series 2, image 64. Other: No free fluid or evidence of intra-abdominal bleed. No mesenteric hematoma. Significant subcutaneous soft tissue changes to suggest subcutaneous hematoma. There is no free fluid or free air. Musculoskeletal: Advanced degenerative change in the spine. Postsurgical change in the right proximal femur. There are no acute or suspicious osseous abnormalities. IMPRESSION: 1. No evidence of retroperitoneal hemorrhage or bleed. 2. New mild left hydronephrosis and hydroureter to the level of the known left  adnexal mass. This is new from prior exam. The lesion is not significantly changed in size. 3. Unchanged appearance of pancreatic tail mass. Stable cystic structure in the right ovary from recent exam. 4. Colonic diverticulosis without diverticulitis. Aortic Atherosclerosis (ICD10-I70.0). Electronically Signed   By: Keith Rake M.D.   On: 08/26/2019 18:38    Procedures Procedures (including critical care time)  Medications Ordered in ED Medications - No data to display  ED Course  I have reviewed the triage vital signs and the nursing notes.  Pertinent labs & imaging results that were available during my care of the patient were reviewed by me and considered in my medical decision making (see chart for details).    MDM Rules/Calculators/A&P                          Pt comes in with cc of bruising. She had a recent pancreatic biopsy. On exam no clinical concerns, besides periumbical ecchymoses. I doubt retroperitoneal bleed. I discussed this w/ the patent and daughter, but they informed me that she has had bleeding w/o pain in the past and It seems like GI had advised CT as well. Ct ordered.  7:25 PM Ct shows no retroperitoneal bleed. Results discussed.  The patient appears reasonably screened and/or stabilized for discharge and I doubt any other medical condition or other [facility] requiring further screening, evaluation, or treatment in the ED at this time prior to discharge.   Results from the ER workup discussed with the patient face to face and all questions answered to the best of my ability. The patient is safe for discharge with strict return precautions.   Final Clinical Impression(s) / ED Diagnoses Final diagnoses:  Contusion of abdominal wall, initial encounter    Rx / DC Orders ED Discharge Orders    None  Varney Biles, MD 08/26/19 1925

## 2019-08-26 NOTE — Telephone Encounter (Signed)
Received a call from patient's daughter.  Patient had EUS with Dr. Ardis Hughs on 7/29.  Patient was told to hold her Xarelto on Thursday and resume it on Friday, 7/30.  Patient accidentally took her Xarelto on Thursday.  This morning her daughter noticed what looks like bruising at her bellybutton.  Patient denies any pain and there has been no sign of black or bloody stools.  I advised the patient and her daughter that I do not think that this is related to the procedure that she had performed and not likely representative of any complication from that.  I suggested that if it is persistent then she be evaluated by her PCP early next week.  Certainly if she develops any significant abdominal pain or sign of gastrointestinal bleeding then she should proceed to the emergency department.

## 2019-08-26 NOTE — Discharge Instructions (Addendum)
The CT scan didn't reveal any internal bleeding. The bruising is limited to skin/muscle. Continue with your medications.  There are other changes noted consistent with the tumor - have your primary care doctor review them with you.

## 2019-08-28 LAB — CYTOLOGY - NON PAP

## 2019-08-29 DIAGNOSIS — C252 Malignant neoplasm of tail of pancreas: Secondary | ICD-10-CM

## 2019-09-07 ENCOUNTER — Encounter: Payer: Self-pay | Admitting: Podiatry

## 2019-09-07 ENCOUNTER — Other Ambulatory Visit: Payer: Self-pay

## 2019-09-07 ENCOUNTER — Ambulatory Visit (INDEPENDENT_AMBULATORY_CARE_PROVIDER_SITE_OTHER): Payer: Medicare Other | Admitting: Podiatry

## 2019-09-07 DIAGNOSIS — L84 Corns and callosities: Secondary | ICD-10-CM | POA: Diagnosis not present

## 2019-09-07 DIAGNOSIS — I739 Peripheral vascular disease, unspecified: Secondary | ICD-10-CM

## 2019-09-07 DIAGNOSIS — M79674 Pain in right toe(s): Secondary | ICD-10-CM

## 2019-09-07 DIAGNOSIS — B351 Tinea unguium: Secondary | ICD-10-CM

## 2019-09-07 DIAGNOSIS — M79675 Pain in left toe(s): Secondary | ICD-10-CM | POA: Diagnosis not present

## 2019-09-07 NOTE — Progress Notes (Signed)
Subjective: Deborah Ball is a 86 y.o. female patient seen today on long term blood thinner for chronic painful plantar calluses right >left, chronic painful corn right 5th digit, and presents today with painful, discolored, thick toenails which interfere with daily activities.  She states she has been diagnosed with pancreatic cancer. She had a PET scan done and it also revealed ovarian cancer. She has chosen not to seek any treatment for the cancer.   Dr. Gilford Rile is her PCP. Last visit was 07/24/2019.  Patient Active Problem List   Diagnosis Date Noted  . 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 Iron deficiency anemia 07/23/2018    Current Outpatient Medications on File Prior to Visit  Medication Sig Dispense Refill  . bisacodyl (DULCOLAX) 5 MG EC tablet Take 5 mg by mouth daily as needed for moderate constipation.    . cholecalciferol (VITAMIN D) 25 MCG (1000 UT) tablet Take 1,000 Units by mouth 2 (two) times a day.    . Denosumab (PROLIA [place]) Inject 1 Dose into the skin every 6 (six) months.     . diclofenac Sodium (VOLTAREN) 1 % GEL Apply 2 g topically daily as needed (Joint pain).     Marland Kitchen diltiazem (CARTIA XT) 300 MG 24 hr capsule Take 300 mg by mouth daily.    . ferrous sulfate 325 (65 FE) MG tablet Take 325 mg by mouth every Monday, Wednesday, and Friday. At lunch    . furosemide (LASIX) 20 MG tablet Take 20 mg by mouth daily as needed (for swelling or high sodium intake--with potassium).     Marland Kitchen levothyroxine (SYNTHROID, LEVOTHROID) 50 MCG tablet Take 50 mcg by mouth daily before breakfast.     . mupirocin ointment (BACTROBAN) 2 % Place 1 application into the nose daily as needed (for skin wounds).     Marland Kitchen omeprazole (PRILOSEC) 20 MG capsule Take 20 mg by mouth 2 (two) times a day.    . potassium chloride (K-DUR,KLOR-CON) 10 MEQ tablet Take 10 mEq by mouth daily as needed (for swelling or high sodium intake---with lasix).     . Propylene Glycol (SYSTANE BALANCE) 0.6  % SOLN Place 1 drop into both eyes 2 (two) times daily as needed (for dry eyes).     . vitamin B-12 (CYANOCOBALAMIN) 1000 MCG tablet Take 1,000 mcg by mouth daily.    Alveda Reasons 15 MG TABS tablet Take 15 mg by mouth at bedtime.      Current Facility-Administered Medications on File Prior to Visit  Medication Dose Route Frequency Provider Last Rate Last Admin  . 0.9 %  sodium chloride infusion  500 mL Intravenous Once Jackquline Denmark, MD        Allergies  Allergen Reactions  . Other Anaphylaxis and Other (See Comments)    Phenbarbitol-unknown reaction, but pt had been told allergic Contrast Dye   . Statins Other (See Comments)    Joint pain.   . Sulfur Rash  . Clarithromycin Other (See Comments)    Joint pain   . Iodinated Diagnostic Agents Other (See Comments)    Tremors and shaking  . Morphine Other (See Comments)    AMS, Agitation    . Aspirin     Bruising per patient  . Ciprofloxacin Other (See Comments)    joints  . Food     NUTS AND SEEDS == DUE TO diverticulitis  DOES NOT EAT CHOCOLATE OR TAKE CAFFEINE PRODUCTS  . Penicillins     Did it involve swelling  of the face/tongue/throat, SOB, or low BP? Unknown Did it involve sudden or severe rash/hives, skin peeling, or any reaction on the inside of your mouth or nose? Unknown Did you need to seek medical attention at a hospital or doctor's office? Unknown When did it last happen?unable to clarify If all above answers are "NO", may proceed with cephalosporin use.  Unknown   . Phenobarbital     Other reaction(s): UNKNOWN  . Sulfa Antibiotics Rash   Objective: Physical Exam  General: Deborah Ball is a pleasant 86 y.o.  Caucasian female, in NAD. AAO x 3.   Vascular:  Neurovascular status unchanged b/l. Capillary fill time to digits <3 seconds b/l. Faintly palpable DP pulses b/l. Nonpalpable PT pulses b/l. Pedal hair sparse b/l. Skin temperature gradient within normal limits b/l. No pain with calf compression  b/l. Varicosities present b/l.  Dermatological:  Pedal skin with normal turgor, texture and tone bilaterally. No open wounds bilaterally. No interdigital macerations bilaterally. Toenails 1-5 b/l elongated, dystrophic, thickened, crumbly with subungual debris and tenderness to dorsal palpation. Hyperkeratotic lesion(s) R 5th toe and 1st metatarsal head left foot.  No erythema, no edema, no drainage, no flocculence. Submet head 1 hyperkeratotic lesion right foot has visible subdermal hemorrhage. No surrounding erythema, no edema, no drainage, no flocculence.  Musculoskeletal:  Normal muscle strength 5/5 to all lower extremity muscle groups bilaterally. No pain crepitus or joint limitation noted with ROM b/l. Hammertoes noted to the L 5th toe and R 5th toe.  Neurological:  Protective sensation intact 5/5 intact bilaterally with 10g monofilament b/l. Vibratory sensation intact b/l.  Assessment and Plan:  1. Pain due to onychomycosis of toenails of both feet   2. Corns and callosities   3. PAD (peripheral artery disease) (HCC)    -Examined patient. -Toenails 1-5 b/l were debrided in length and girth with sterile nail nippers and dremel without iatrogenic bleeding.  -Corn(s) R 5th toe and callus(es) submet head 1 right foot and 1st metatarsal head left foot were pared utilizing sterile scalpel blade without incident. Total number debrided =3. -Dispensed Silipos toe protectors for b/l 5th digits for daily protection. Apply every morning, remove every evening. -Patient to continue soft, supportive shoe gear daily. -Patient to report any pedal injuries to medical professional immediately. -Patient/POA to call should there be question/concern in the interim.  Return in about 9 weeks (around 11/09/2019).  Marzetta Board, DPM

## 2019-09-12 DIAGNOSIS — C252 Malignant neoplasm of tail of pancreas: Secondary | ICD-10-CM

## 2019-11-09 ENCOUNTER — Ambulatory Visit (INDEPENDENT_AMBULATORY_CARE_PROVIDER_SITE_OTHER): Payer: Medicare Other | Admitting: Podiatry

## 2019-11-09 ENCOUNTER — Encounter: Payer: Self-pay | Admitting: Podiatry

## 2019-11-09 ENCOUNTER — Other Ambulatory Visit: Payer: Self-pay

## 2019-11-09 DIAGNOSIS — L84 Corns and callosities: Secondary | ICD-10-CM

## 2019-11-09 DIAGNOSIS — I739 Peripheral vascular disease, unspecified: Secondary | ICD-10-CM | POA: Diagnosis not present

## 2019-11-09 DIAGNOSIS — B351 Tinea unguium: Secondary | ICD-10-CM | POA: Diagnosis not present

## 2019-11-09 DIAGNOSIS — M79675 Pain in left toe(s): Secondary | ICD-10-CM

## 2019-11-09 DIAGNOSIS — M79674 Pain in right toe(s): Secondary | ICD-10-CM | POA: Diagnosis not present

## 2019-11-09 NOTE — Progress Notes (Signed)
Subjective: Deborah Ball is a 86 y.o. female patient seen today on long term blood thinner for chronic painful plantar calluses right >left, chronic painful corn right 5th digit, and presents today with painful, discolored, thick toenails which interfere with daily activities.  Her daughter is present during today's visit.  She states callus feels better with her 9 week interval. They voice no new pedal concerns on today's visit.   Dr. Gilford Rile is her PCP. Last visit was 07/24/2019.  Patient Active Problem List   Diagnosis Date Noted   Inflammatory fibroid polyps of stomach 07/23/2018   Iron deficiency anemia 07/23/2018   Angiodysplasia of colon 05/17/2018   At high risk for falls 04/22/2017    Current Outpatient Medications on File Prior to Visit  Medication Sig Dispense Refill   bisacodyl (DULCOLAX) 5 MG EC tablet Take 5 mg by mouth daily as needed for moderate constipation.     cholecalciferol (VITAMIN D) 25 MCG (1000 UT) tablet Take 1,000 Units by mouth 2 (two) times a day.     Denosumab (PROLIA ) Inject 1 Dose into the skin every 6 (six) months.      diclofenac Sodium (VOLTAREN) 1 % GEL Apply 2 g topically daily as needed (Joint pain).      diltiazem (CARTIA XT) 300 MG 24 hr capsule Take 300 mg by mouth daily.     ferrous sulfate 325 (65 FE) MG tablet Take 325 mg by mouth every Monday, Wednesday, and Friday. At lunch     furosemide (LASIX) 20 MG tablet Take 20 mg by mouth daily as needed (for swelling or high sodium intake--with potassium).      levothyroxine (SYNTHROID, LEVOTHROID) 50 MCG tablet Take 50 mcg by mouth daily before breakfast.      mupirocin ointment (BACTROBAN) 2 % Place 1 application into the nose daily as needed (for skin wounds).      omeprazole (PRILOSEC) 20 MG capsule Take 20 mg by mouth 2 (two) times a day.     potassium chloride (K-DUR,KLOR-CON) 10 MEQ tablet Take 10 mEq by mouth daily as needed (for swelling or high sodium  intake---with lasix).      Propylene Glycol (SYSTANE BALANCE) 0.6 % SOLN Place 1 drop into both eyes 2 (two) times daily as needed (for dry eyes).      vitamin B-12 (CYANOCOBALAMIN) 1000 MCG tablet Take 1,000 mcg by mouth daily.     XARELTO 15 MG TABS tablet Take 15 mg by mouth at bedtime.      Current Facility-Administered Medications on File Prior to Visit  Medication Dose Route Frequency Provider Last Rate Last Admin   0.9 %  sodium chloride infusion  500 mL Intravenous Once Jackquline Denmark, MD        Allergies  Allergen Reactions   Other Anaphylaxis and Other (See Comments)    Phenbarbitol-unknown reaction, but pt had been told allergic Contrast Dye    Statins Other (See Comments)    Joint pain.    Sulfur Rash   Clarithromycin Other (See Comments)    Joint pain    Iodinated Diagnostic Agents Other (See Comments)    Tremors and shaking   Morphine Other (See Comments)    AMS, Agitation     Aspirin     Bruising per patient   Ciprofloxacin Other (See Comments)    joints   Food     NUTS AND SEEDS == DUE TO diverticulitis  DOES NOT EAT CHOCOLATE OR TAKE CAFFEINE PRODUCTS  Penicillins     Did it involve swelling of the face/tongue/throat, SOB, or low BP? Unknown Did it involve sudden or severe rash/hives, skin peeling, or any reaction on the inside of your mouth or nose? Unknown Did you need to seek medical attention at a hospital or doctor's office? Unknown When did it last happen?unable to clarify If all above answers are "NO", may proceed with cephalosporin use.  Unknown    Phenobarbital     Other reaction(s): UNKNOWN   Sulfa Antibiotics Rash   Objective: Physical Exam  General: Deborah Ball is a pleasant 86 y.o.  Caucasian female, in NAD. AAO x 3.   Vascular:  Neurovascular status unchanged b/l. Capillary fill time to digits <3 seconds b/l. Faintly palpable DP pulses b/l. Nonpalpable PT pulses b/l. Pedal hair sparse b/l. Skin temperature  gradient within normal limits b/l. No pain with calf compression b/l. Varicosities present b/l.  Dermatological:  Pedal skin with normal turgor, texture and tone bilaterally. No open wounds bilaterally. No interdigital macerations bilaterally. Toenails 1-5 b/l elongated, dystrophic, thickened, crumbly with subungual debris and tenderness to dorsal palpation. Hyperkeratotic lesion(s) R 5th toe and 1st metatarsal head left foot.  No erythema, no edema, no drainage, no flocculence. Submet head 1 hyperkeratotic lesion right foot has visible subdermal hemorrhage. No surrounding erythema, no edema, no drainage, no flocculence.  Musculoskeletal:  Normal muscle strength 5/5 to all lower extremity muscle groups bilaterally. No pain crepitus or joint limitation noted with ROM b/l. Hammertoes noted to the L 5th toe and R 5th toe.  Neurological:  Protective sensation intact 5/5 intact bilaterally with 10g monofilament b/l. Vibratory sensation intact b/l.  Assessment and Plan:  1. Pain due to onychomycosis of toenails of both feet   2. Corns and callosities   3. PAD (peripheral artery disease) (HCC)    -Examined patient. -Toenails 1-5 b/l were debrided in length and girth with sterile nail nippers and dremel without iatrogenic bleeding.  -Corn(s) R 5th toe and callus(es) submet head 1 right foot and 1st metatarsal head left foot were pared utilizing sterile scalpel blade without incident. Total number debrided =3. -Dispensed Silipos toe protectors for b/l 5th digits for daily protection. Apply every morning, remove every evening. -Patient to continue soft, supportive shoe gear daily. -Patient to report any pedal injuries to medical professional immediately. -Patient/POA to call should there be question/concern in the interim.  Return in about 9 weeks (around 01/11/2020).  Marzetta Board, DPM

## 2019-11-14 DIAGNOSIS — N390 Urinary tract infection, site not specified: Secondary | ICD-10-CM | POA: Insufficient documentation

## 2019-11-15 DIAGNOSIS — C252 Malignant neoplasm of tail of pancreas: Secondary | ICD-10-CM | POA: Diagnosis not present

## 2019-11-23 DIAGNOSIS — Z9889 Other specified postprocedural states: Secondary | ICD-10-CM | POA: Insufficient documentation

## 2020-01-18 DIAGNOSIS — K5720 Diverticulitis of large intestine with perforation and abscess without bleeding: Secondary | ICD-10-CM | POA: Insufficient documentation

## 2020-01-25 ENCOUNTER — Ambulatory Visit (INDEPENDENT_AMBULATORY_CARE_PROVIDER_SITE_OTHER): Payer: Medicare Other | Admitting: Podiatry

## 2020-01-25 ENCOUNTER — Other Ambulatory Visit: Payer: Self-pay

## 2020-01-25 ENCOUNTER — Encounter: Payer: Self-pay | Admitting: Podiatry

## 2020-01-25 ENCOUNTER — Other Ambulatory Visit: Payer: Self-pay | Admitting: *Deleted

## 2020-01-25 DIAGNOSIS — I739 Peripheral vascular disease, unspecified: Secondary | ICD-10-CM | POA: Diagnosis not present

## 2020-01-25 DIAGNOSIS — B351 Tinea unguium: Secondary | ICD-10-CM

## 2020-01-25 DIAGNOSIS — M79675 Pain in left toe(s): Secondary | ICD-10-CM

## 2020-01-25 DIAGNOSIS — L84 Corns and callosities: Secondary | ICD-10-CM

## 2020-01-25 DIAGNOSIS — M79674 Pain in right toe(s): Secondary | ICD-10-CM

## 2020-01-26 NOTE — Progress Notes (Signed)
Subjective: Deborah Ball is a 86 y.o. female patient seen today on long term blood thinner for chronic painful plantar calluses right >left, chronic painful corn right 5th digit, and presents today with painful, discolored, thick toenails which interfere with daily activities.  She states she received several pairs of Skechers for Christmas. She is pleased with them and they are comfortable.  Dr. Gilford Rile is her PCP. Last visit was 01/18/2020.  Patient Active Problem List   Diagnosis Date Noted  . Diverticulitis of large intestine with perforation without bleeding 01/18/2020  . History of repair of rectocele 11/23/2019  . Frequent UTI 11/14/2019  . 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 Iron deficiency anemia 07/23/2018  . GI bleed 07/20/2018  . Angiodysplasia of colon 05/17/2018  . Essential tremor 01/20/2018  . Hoarseness of voice 01/20/2018  . Hereditary hemochromatosis ([place]) 11/24/2017  . Cirrhosis of liver ([place]) 11/20/2017  . At high risk for falls 04/22/2017  . Chronic combined systolic and diastolic congestive heart failure ([place]) 04/22/2017  . Coronary artery disease of native artery of native heart with stable angina pectoris ([place]) 04/22/2017  . DDD (degenerative disc disease), lumbar 04/22/2017  . Diverticulosis of intestine without bleeding 04/22/2017  . High risk medication use 04/22/2017  . History of bilateral breast cancer 04/22/2017  . Hypothyroidism (acquired) 04/22/2017  . Breast cancer, female ([place]) 12/08/2016  . History of hypothyroidism 03/04/2016    Current Outpatient Medications on File Prior to Visit  Medication Sig Dispense Refill  . bisacodyl (DULCOLAX) 5 MG EC tablet Take 5 mg by mouth daily as needed for moderate constipation.    . cholecalciferol (VITAMIN D) 25 MCG (1000 UT) tablet Take 1,000 Units by mouth 2 (two) times a day.    . Denosumab (PROLIA [place]) Inject 1 Dose into the skin every 6 (six) months.     . diclofenac Sodium  (VOLTAREN) 1 % GEL Apply 2 g topically daily as needed (Joint pain).     Marland Kitchen diltiazem (CARTIA XT) 300 MG 24 hr capsule Take 300 mg by mouth daily.    . ferrous sulfate 325 (65 FE) MG tablet Take 325 mg by mouth every Monday, Wednesday, and Friday. At lunch    . furosemide (LASIX) 20 MG tablet Take 20 mg by mouth daily as needed (for swelling or high sodium intake--with potassium).     Marland Kitchen levothyroxine (SYNTHROID, LEVOTHROID) 50 MCG tablet Take 50 mcg by mouth daily before breakfast.     . mupirocin ointment (BACTROBAN) 2 % Place 1 application into the nose daily as needed (for skin wounds).     Marland Kitchen omeprazole (PRILOSEC) 20 MG capsule Take 20 mg by mouth 2 (two) times a day.    . potassium chloride (K-DUR,KLOR-CON) 10 MEQ tablet Take 10 mEq by mouth daily as needed (for swelling or high sodium intake---with lasix).     . Propylene Glycol (SYSTANE BALANCE) 0.6 % SOLN Place 1 drop into both eyes 2 (two) times daily as needed (for dry eyes).     . vitamin B-12 (CYANOCOBALAMIN) 1000 MCG tablet Take 1,000 mcg by mouth daily.    Alveda Reasons 15 MG TABS tablet Take 15 mg by mouth at bedtime.      Current Facility-Administered Medications on File Prior to Visit  Medication Dose Route Frequency Provider Last Rate Last Admin  . 0.9 %  sodium chloride infusion  500 mL Intravenous Once Jackquline Denmark, MD        Allergies  Allergen Reactions  .  Other Anaphylaxis and Other (See Comments)    Phenbarbitol-unknown reaction, but pt had been told allergic Contrast Dye   . Statins Other (See Comments)    Joint pain.   . Sulfur Rash  . Clarithromycin Other (See Comments)    Joint pain   . Iodinated Diagnostic Agents Other (See Comments)    Tremors and shaking  . Morphine Other (See Comments)    AMS, Agitation    . Aspirin     Bruising per patient  . Ciprofloxacin Other (See Comments)    joints  . Food     NUTS AND SEEDS == DUE TO diverticulitis  DOES NOT EAT CHOCOLATE OR TAKE CAFFEINE PRODUCTS  .  Penicillins     Did it involve swelling of the face/tongue/throat, SOB, or low BP? Unknown Did it involve sudden or severe rash/hives, skin peeling, or any reaction on the inside of your mouth or nose? Unknown Did you need to seek medical attention at a hospital or doctor's office? Unknown When did it last happen?unable to clarify If all above answers are "NO", may proceed with cephalosporin use.  Unknown   . Phenobarbital     Other reaction(s): UNKNOWN  . Sulfa Antibiotics Rash   Objective: Physical Exam  General: Deborah Ball is a pleasant 86 y.o.  Caucasian female, in NAD. AAO x 3.   Vascular:  Neurovascular status unchanged b/l. Capillary fill time to digits <3 seconds b/l. Faintly palpable DP pulses b/l. Nonpalpable PT pulses b/l. Pedal hair sparse b/l. Skin temperature gradient within normal limits b/l. No pain with calf compression b/l. Varicosities present b/l.  Dermatological:  Pedal skin with normal turgor, texture and tone bilaterally. No open wounds bilaterally. No interdigital macerations bilaterally. Toenails 1-5 b/l elongated, dystrophic, thickened, crumbly with subungual debris and tenderness to dorsal palpation. Hyperkeratotic lesion(s) R 5th toe and 1st metatarsal head left foot.  No erythema, no edema, no drainage, no flocculence. Submet head 1 hyperkeratotic lesion right foot has visible subdermal hemorrhage. No surrounding erythema, no edema, no drainage, no flocculence.  Musculoskeletal:  Normal muscle strength 5/5 to all lower extremity muscle groups bilaterally. No pain crepitus or joint limitation noted with ROM b/l. Hammertoes noted to the L 5th toe and R 5th toe.  Neurological:  Protective sensation intact 5/5 intact bilaterally with 10g monofilament b/l. Vibratory sensation intact b/l.  Assessment and Plan:  1. Pain due to onychomycosis of toenails of both feet   2. Corns and callosities   3. PAD (peripheral artery disease) (HCC)    -Examined  patient. -Toenails 1-5 b/l were debrided in length and girth with sterile nail nippers and dremel without iatrogenic bleeding.  -Corn(s) R 5th toe and callus(es) submet head 1 right foot and 1st metatarsal head left foot were pared utilizing sterile scalpel blade without incident. Total number debrided =3. -Continue Skechers shoe gear daily. -Patient to report any pedal injuries to medical professional immediately. -Patient/POA to call should there be question/concern in the interim.  Return in about 3 months (around 04/24/2020).  Freddie Breech, DPM

## 2020-02-15 ENCOUNTER — Ambulatory Visit: Payer: Medicare Other | Admitting: Oncology

## 2020-02-27 ENCOUNTER — Inpatient Hospital Stay: Payer: Medicare Other | Attending: Oncology | Admitting: Oncology

## 2020-02-27 ENCOUNTER — Other Ambulatory Visit: Payer: Self-pay

## 2020-02-27 VITALS — BP 134/85 | HR 92 | Temp 98.3°F | Resp 14 | Ht 64.0 in | Wt 144.6 lb

## 2020-02-27 DIAGNOSIS — C257 Malignant neoplasm of other parts of pancreas: Secondary | ICD-10-CM | POA: Diagnosis not present

## 2020-02-27 DIAGNOSIS — C562 Malignant neoplasm of left ovary: Secondary | ICD-10-CM | POA: Diagnosis not present

## 2020-03-02 NOTE — Progress Notes (Signed)
[facility]  [address] [place],  [place]  [address] [phone]  Clinic Day:  03/02/2020  Referring physician: Raina Mina., MD   HISTORY OF PRESENT ILLNESS:  The patient is a 87 y.o. female who I follow for what clinically appears to be stage IB (T2 N0 M0) pancreatic adenocarcinoma.  She also has an enlarging left adnexal mass for which an ovarian neoplasm is highly suspected.  As the patient has a good quality of life and is currently uninterested in treatment at this point in her lfie, biopsies of these respective areas have not been done.  She comes in today for routine followup.  Since her last visit, she has been doing fairly well.  Despite having pancreatic cancer, she denies having any abdominal pain radiating to her back.  She also denies having any pelvic discomfort or fullness related to her left adnexal mass.  However, erratic bowel movements remain an issue.  In fact, she was hospitalized in December for acute diverticulitis.  IV antibiotics were given, which alleviated this issue.  She was also having vaginal discharge.  However, a pelvic exam in the emergency room did not reveal any abnormal gynecologic findings.     PHYSICAL EXAM:  Blood pressure 134/85, pulse 92, temperature 98.3 F (36.8 C), resp. rate 14, height 5\' 4"  (1.626 m), weight 144 lb 9.6 oz (65.6 kg), SpO2 95 %. Wt Readings from Last 3 Encounters:  02/27/20 144 lb 9.6 oz (65.6 kg)  08/26/19 155 lb (70.3 kg)  08/24/19 155 lb (70.3 kg)   Body mass index is 24.82 kg/m. Performance status (ECOG): 2 - Symptomatic, <50% confined to bed Physical Exam Constitutional:      Appearance: Normal appearance. She is not ill-appearing.  HENT:     Mouth/Throat:     Mouth: Mucous membranes are moist.     Pharynx: Oropharynx is clear. No oropharyngeal exudate or posterior oropharyngeal erythema.  Cardiovascular:     Rate and Rhythm: Normal rate and regular rhythm.     Heart sounds:  No murmur heard. No friction rub. No gallop.   Pulmonary:     Effort: Pulmonary effort is normal. No respiratory distress.     Breath sounds: Normal breath sounds. No wheezing, rhonchi or rales.  Chest:  Breasts:     Right: No axillary adenopathy or supraclavicular adenopathy.     Left: No axillary adenopathy or supraclavicular adenopathy.    Abdominal:     General: Bowel sounds are normal. There is no distension.     Palpations: Abdomen is soft. There is no mass.     Tenderness: There is no abdominal tenderness.  Musculoskeletal:        General: No swelling.     Right lower leg: No edema.     Left lower leg: No edema.  Lymphadenopathy:     Cervical: No cervical adenopathy.     Upper Body:     Right upper body: No supraclavicular or axillary adenopathy.     Left upper body: No supraclavicular or axillary adenopathy.     Lower Body: No right inguinal adenopathy. No left inguinal adenopathy.  Skin:    General: Skin is warm.     Coloration: Skin is not jaundiced.     Findings: No lesion or rash.  Neurological:     General: No focal deficit present.     Mental Status: She is alert and oriented to person, place, and time. Mental status is at baseline.  Cranial Nerves: Cranial nerves are intact.  Psychiatric:        Mood and Affect: Mood normal.        Behavior: Behavior normal.        Thought Content: Thought content normal.     ASSESSMENT & PLAN:  Assessment/Plan:  A 87 y.o. female who clinically appears to have stage IB (T2 N0 M0) pancreatic adenocarcinoma.  She also has a left adnexal mass which is worrisome for ovarian cancer.  The patient reiterated today that she remains uninterested in treatment at this time, particularly as her quality of life remains decent.  Most of this visit was focused on what symptoms she could expect if either malignancy was to progress.  With respect to her pancreatic cancer, she understands abdominal pain radiating to her back and early satiety  could be issues.  With respect to her suspected ovarian neoplasm, she understands abdominal swelling, changes in bowel habits and abdominal pain could develop over time with disease progression. As we will continue to take a conservative approach, I will see her back in 4 months for repeat clinical assessment.   The patient and her daughter understand all the plans discussed today and are in agreement with them.    Shawntae Lowy Macarthur Critchley, MD

## 2020-03-07 ENCOUNTER — Telehealth: Payer: Self-pay

## 2020-03-07 NOTE — Telephone Encounter (Addendum)
@   [phone]- I notified Ronalee Belts that Dr Bobby Rumpf is agreeable to [facility] recertification orders as well as aide visits.   Message left yesterday @ 402p by Bary Leriche w/[facility]- went to answering service.  I received the note this morning. Ronalee Belts Mowers, with [facility], requesting orders to recertify home health care for once a week for 1 week, then two times a week for 1 week then once weekly for 6 weeks. Home health aide visits also.  [phone]

## 2020-04-25 ENCOUNTER — Ambulatory Visit (INDEPENDENT_AMBULATORY_CARE_PROVIDER_SITE_OTHER): Payer: Medicare Other | Admitting: Podiatry

## 2020-04-25 ENCOUNTER — Encounter: Payer: Self-pay | Admitting: Podiatry

## 2020-04-25 ENCOUNTER — Other Ambulatory Visit: Payer: Self-pay

## 2020-04-25 DIAGNOSIS — M79675 Pain in left toe(s): Secondary | ICD-10-CM

## 2020-04-25 DIAGNOSIS — I739 Peripheral vascular disease, unspecified: Secondary | ICD-10-CM

## 2020-04-25 DIAGNOSIS — L84 Corns and callosities: Secondary | ICD-10-CM | POA: Diagnosis not present

## 2020-04-25 DIAGNOSIS — M79674 Pain in right toe(s): Secondary | ICD-10-CM

## 2020-04-25 DIAGNOSIS — B351 Tinea unguium: Secondary | ICD-10-CM

## 2020-04-25 NOTE — Progress Notes (Signed)
Subjective: Deborah Ball is a 87 y.o. female patient seen today on long term blood thinner for chronic painful plantar callus, chronic painful corn right 5th digit, and presents today with painful, discolored, thick toenails which interfere with daily activities.  She voices no new pedal concerns on today's visit. Her daughter is present during today's visit.  Dr. Gilford Rile is her PCP. Last visit was 02/28/2020.  Allergies  Allergen Reactions  . Elemental Sulfur Rash  . Other Anaphylaxis and Other (See Comments)    Phenbarbitol-unknown reaction, but pt had been told allergic Contrast Dye   . Statins Other (See Comments)    Joint pain.   . Clarithromycin Other (See Comments)    Joint pain   . Iodinated Diagnostic Agents Other (See Comments)    Tremors and shaking  . Morphine Other (See Comments)    AMS, Agitation    . Aspirin     Bruising per patient  . Ciprofloxacin Other (See Comments)    joints  . Food     NUTS AND SEEDS == DUE TO diverticulitis  DOES NOT EAT CHOCOLATE OR TAKE CAFFEINE PRODUCTS  . Penicillins     Did it involve swelling of the face/tongue/throat, SOB, or low BP? Unknown Did it involve sudden or severe rash/hives, skin peeling, or any reaction on the inside of your mouth or nose? Unknown Did you need to seek medical attention at a hospital or doctor's office? Unknown When did it last happen?unable to clarify If all above answers are "NO", may proceed with cephalosporin use.  Unknown   . Phenobarbital     Other reaction(s): UNKNOWN  . Sulfa Antibiotics Rash   Objective: Physical Exam  General: Deborah Ball is a pleasant 87 y.o.  Caucasian female, in NAD. AAO x 3.   Vascular:  Neurovascular status unchanged b/l. Capillary fill time to digits <3 seconds b/l. Faintly palpable DP pulses b/l. Nonpalpable PT pulses b/l. Pedal hair sparse b/l. Skin temperature gradient within normal limits b/l. No pain with calf compression b/l.  Varicosities present b/l.  Dermatological:  Pedal skin with normal turgor, texture and tone bilaterally. No open wounds bilaterally. No interdigital macerations bilaterally. Toenails 1-5 b/l elongated, dystrophic, thickened, crumbly with subungual debris and tenderness to dorsal palpation. Hyperkeratotic lesion(s) dprsal R 5th toe.  No erythema, no edema, no drainage, no fluctuance. Submet head 1 hyperkeratotic lesion right foot has visible subdermal hemorrhage. No surrounding erythema, no edema, no drainage, no fluctuance.  Musculoskeletal:  Normal muscle strength 5/5 to all lower extremity muscle groups bilaterally. No pain crepitus or joint limitation noted with ROM b/l. Hammertoes noted to the L 5th toe and R 5th toe.  Neurological:  Protective sensation intact 5/5 intact bilaterally with 10g monofilament b/l. Vibratory sensation intact b/l.  Assessment and Plan:  1. Pain due to onychomycosis of toenails of both feet   2. Corns and callosities   3. PAD (peripheral artery disease) (HCC)    -Examined patient. -Toenails 1-5 b/l were debrided in length and girth with sterile nail nippers and dremel without iatrogenic bleeding.  -Corn(s) R 5th toe and callus(es) submet head 1 right foot were pared utilizing sterile scalpel blade without incident. Total number debrided =2. -Continue [employer] shoe gear daily. -Patient to report any pedal injuries to medical professional immediately. -Patient/POA to call should there be question/concern in the interim.  Return in about 3 months (around 07/25/2020).  Marzetta Board, DPM

## 2020-05-20 DIAGNOSIS — N824 Other female intestinal-genital tract fistulae: Secondary | ICD-10-CM | POA: Insufficient documentation

## 2020-05-22 ENCOUNTER — Telehealth: Payer: Self-pay | Admitting: Oncology

## 2020-05-22 NOTE — Telephone Encounter (Signed)
Patient rescheduled from 4/29 to 5/9 Follow Up at 1:45 pm - Per Dr Bobby Rumpf request

## 2020-05-24 ENCOUNTER — Inpatient Hospital Stay: Payer: Medicare Other | Admitting: Oncology

## 2020-06-02 NOTE — Progress Notes (Signed)
[facility]  [address] [place],  [place]  [address] [phone]  Clinic Day:  06/03/2020  Referring physician: Raina Mina., MD   HISTORY OF PRESENT ILLNESS:  The patient is a 87 y.o. female who I follow for what clinically appears to be stage IB (T2 N0 M0) pancreatic adenocarcinoma.  She also has an enlarging left adnexal mass for which an ovarian neoplasm is highly suspected.  As the patient has a good quality of life and is currently uninterested in treatment at this point in her lfie, biopsies of these respective areas have not been done.  She comes in today for routine followup.  Since her last visit, she has been doing fairly well.  Despite having pancreatic cancer, she denies having any abdominal pain radiating to her back.  She also denies having any pelvic discomfort or fullness related to her left adnexal mass.  However, erratic bowel movements remain an issue.  Per her vantage point, she has liquid-like fecal discharge coming from her vagina.  She was recently seen by gynecology-oncology, who felt a colovaginal fistula was present.  However, no intervention was apparently discussed. She has occasional constipation for which she drinks prune juice to alleviate.  However, no other significant changes have occurred which have impacted her daily quality of life.     PHYSICAL EXAM:  Blood pressure (!) 147/71, pulse 97, temperature 98.5 F (36.9 C), resp. rate 14, height 5\' 4"  (1.626 m), weight 136 lb 9.6 oz (62 kg), SpO2 99 %. Wt Readings from Last 3 Encounters:  06/03/20 136 lb 9.6 oz (62 kg)  02/27/20 144 lb 9.6 oz (65.6 kg)  08/26/19 155 lb (70.3 kg)   Body mass index is 23.45 kg/m. Performance status (ECOG): 2 - Symptomatic, <50% confined to bed Physical Exam Constitutional:      Appearance: Normal appearance. She is not ill-appearing.  HENT:     Mouth/Throat:     Mouth: Mucous membranes are moist.     Pharynx: Oropharynx is clear. No  oropharyngeal exudate or posterior oropharyngeal erythema.  Cardiovascular:     Rate and Rhythm: Normal rate and regular rhythm.     Heart sounds: No murmur heard. No friction rub. No gallop.   Pulmonary:     Effort: Pulmonary effort is normal. No respiratory distress.     Breath sounds: Normal breath sounds. No wheezing, rhonchi or rales.  Chest:  Breasts:     Right: No axillary adenopathy or supraclavicular adenopathy.     Left: No axillary adenopathy or supraclavicular adenopathy.    Abdominal:     General: Bowel sounds are normal. There is no distension.     Palpations: Abdomen is soft. There is no mass.     Tenderness: There is no abdominal tenderness.  Musculoskeletal:        General: No swelling.     Right lower leg: No edema.     Left lower leg: No edema.  Lymphadenopathy:     Cervical: No cervical adenopathy.     Upper Body:     Right upper body: No supraclavicular or axillary adenopathy.     Left upper body: No supraclavicular or axillary adenopathy.     Lower Body: No right inguinal adenopathy. No left inguinal adenopathy.  Skin:    General: Skin is warm.     Coloration: Skin is not jaundiced.     Findings: No lesion or rash.  Neurological:     General: No focal deficit  present.     Mental Status: She is alert and oriented to person, place, and time. Mental status is at baseline.     Cranial Nerves: Cranial nerves are intact.  Psychiatric:        Mood and Affect: Mood normal.        Behavior: Behavior normal.        Thought Content: Thought content normal.     ASSESSMENT & PLAN:  Assessment/Plan:  A 87 y.o. female who clinically appears to have stage IB (T2 N0 M0) pancreatic adenocarcinoma.  She also has a left adnexal mass which is worrisome for ovarian cancer.  The patient reiterated today that she remains uninterested in treatment at this time, particularly as her quality of life remains decent.  However, she is somewhat concerned with what her likely 2  untreated malignancies are doing within her body.  I will order CT scans of her abdomen/pelvis before her next visit.  I made it clear to the patient and her daughter that the scans will likely show disease progression.  If they do, they need to be prepared with such news and how they may choose to act upon it.  I will see her back in 2 months to go over her scans and their implications.  The patient and her daughter understand all the plans discussed today and are in agreement with them.    Deborah Lafrance Macarthur Critchley, MD

## 2020-06-03 ENCOUNTER — Other Ambulatory Visit: Payer: Self-pay

## 2020-06-03 ENCOUNTER — Telehealth: Payer: Self-pay | Admitting: Oncology

## 2020-06-03 ENCOUNTER — Inpatient Hospital Stay: Payer: Medicare Other | Attending: Oncology | Admitting: Oncology

## 2020-06-03 VITALS — BP 147/71 | HR 97 | Temp 98.5°F | Resp 14 | Ht 64.0 in | Wt 136.6 lb

## 2020-06-03 DIAGNOSIS — C257 Malignant neoplasm of other parts of pancreas: Secondary | ICD-10-CM | POA: Diagnosis not present

## 2020-06-03 NOTE — Telephone Encounter (Signed)
Per 5/9 LOS, patient scheduled for 7/14 Labs, CT Scan (Allergic to Contrast Dye) - 7/15 Follow Up w/Dr Bobby Rumpf.  Gave patient Appt Summary/Orders/Instructions

## 2020-07-30 ENCOUNTER — Telehealth: Payer: Self-pay | Admitting: Oncology

## 2020-07-30 NOTE — Telephone Encounter (Signed)
Per daughter, patient requested her Appt's to be put off for a couples of months since she is feeling well.  All Appt's have been Canceled

## 2020-08-09 ENCOUNTER — Ambulatory Visit: Payer: Medicare Other | Admitting: Oncology

## 2020-08-14 DIAGNOSIS — I5030 Unspecified diastolic (congestive) heart failure: Secondary | ICD-10-CM | POA: Insufficient documentation

## 2020-08-22 ENCOUNTER — Other Ambulatory Visit: Payer: Self-pay

## 2020-08-22 ENCOUNTER — Ambulatory Visit (INDEPENDENT_AMBULATORY_CARE_PROVIDER_SITE_OTHER): Payer: Medicare Other | Admitting: Podiatry

## 2020-08-22 ENCOUNTER — Encounter: Payer: Self-pay | Admitting: Podiatry

## 2020-08-22 DIAGNOSIS — I739 Peripheral vascular disease, unspecified: Secondary | ICD-10-CM

## 2020-08-22 DIAGNOSIS — M79674 Pain in right toe(s): Secondary | ICD-10-CM

## 2020-08-22 DIAGNOSIS — B351 Tinea unguium: Secondary | ICD-10-CM | POA: Diagnosis not present

## 2020-08-22 DIAGNOSIS — Q828 Other specified congenital malformations of skin: Secondary | ICD-10-CM

## 2020-08-22 DIAGNOSIS — L84 Corns and callosities: Secondary | ICD-10-CM

## 2020-08-22 DIAGNOSIS — M79675 Pain in left toe(s): Secondary | ICD-10-CM | POA: Diagnosis not present

## 2020-08-22 NOTE — Patient Instructions (Signed)
[employer] Oomg Eezee Low Shoes with stretchable uppers and memory foam insoles can be purchased online at: www.oofos.com. They can also be purchased at [employer] shoe store ([employer]), [employer] or [employer].  Also available in clogs, houseslippers, slide styles. 

## 2020-08-22 NOTE — Progress Notes (Signed)
Subjective:  Patient ID: Deborah Ball, female    DOB: 07/15/1922,  MRN: [id number]  Deborah Ball presents to clinic today for for at risk foot care. Patient has h/o PAD and callus(es) right foot and painful thick toenails that are difficult to trim. Painful toenails interfere with ambulation. Aggravating factors include wearing enclosed shoe gear. Pain is relieved with periodic professional debridement. Painful calluses are aggravated when weightbearing with and without shoegear. Pain is relieved with periodic professional debridement.  PCP is Deborah Ball., MD , and last visit was 07/16/2020.  She has h/o ovarian cancer as well as pancreatic cancer. She has decided against treatment for either one. States she is not in any pain. Has not had any scans recently.  Her daughter is present during today's visit. She states she stubbed her toe and had to get an xray on the right great toe since it was swollen, bruised and painful.  [name] celebrated her 71th birthday this month. She enjoyed the celebrations with her family.  Allergies  Allergen Reactions   Elemental Sulfur Rash   Other Anaphylaxis and Other (See Comments)    Phenbarbitol-unknown reaction, but pt had been told allergic Contrast Dye    Statins Other (See Comments)    Joint pain.    Clarithromycin Other (See Comments)    Joint pain    Iodinated Diagnostic Agents Other (See Comments)    Tremors and shaking   Morphine Other (See Comments)    AMS, Agitation     Aspirin     Bruising per patient   Ciprofloxacin Other (See Comments)    joints   Food     NUTS AND SEEDS == DUE TO diverticulitis  DOES NOT EAT CHOCOLATE OR TAKE CAFFEINE PRODUCTS   Penicillins     Did it involve swelling of the face/tongue/throat, SOB, or low BP? Unknown Did it involve sudden or severe rash/hives, skin peeling, or any reaction on the inside of your mouth or nose? Unknown Did you need to seek medical attention at a hospital  or doctor's office? Unknown When did it last happen? unable to clarify If all above answers are "NO", may proceed with cephalosporin use.  Unknown    Phenobarbital     Other reaction(s): UNKNOWN   Sulfa Antibiotics Rash    Review of Systems: Negative except as noted in the HPI. Objective:   Constitutional Deborah Ball is a pleasant 87 y.o. Caucasian female, WD, WN in NAD. AAO x 3.   Vascular Capillary fill time to digits <3 seconds b/l lower extremities. Faintly palpable DP pulse(s) b/l lower extremities. Nonpalpable PT pulse(s) b/l lower extremities. Pedal hair absent. Lower extremity skin temperature gradient within normal limits. No pain with calf compression b/l. No edema noted b/l lower extremities. No cyanosis or clubbing noted.  Neurologic Normal speech. Oriented to person, place, and time. Protective sensation intact 5/5 intact bilaterally with 10g monofilament b/l. Vibratory sensation intact b/l.  Dermatologic Pedal skin with normal turgor, texture and tone b/l lower extremities. No open wounds b/l lower extremities. No interdigital macerations b/l lower extremities. Toenails 1-5 b/l elongated, discolored, dystrophic, thickened, crumbly with subungual debris and tenderness to dorsal palpation. Hyperkeratotic lesion(s) submet head 1 left foot.  No erythema, no edema, no drainage, no fluctuance. Porokeratotic lesion(s) submet head 1 right foot. No erythema, no edema, no drainage, no fluctuance.  Orthopedic: Normal muscle strength 5/5 to all lower extremity muscle groups bilaterally. No pain crepitus or joint limitation noted with ROM  b/l. Hallux valgus with bunion deformity noted b/l lower extremities. Hammertoe(s) noted to the L 5th toe and R 5th toe. No edema/swelling/pain noted in right great toe today.   Radiographs: None Assessment:   1. Pain due to onychomycosis of toenails of both feet   2. Callus   3. Porokeratosis   4. PAD (peripheral artery disease) ([facility])    Plan:   -Examined patient. -Patient to continue soft, supportive shoe gear daily. -Patient stubbed her toe and was xrayed since her last visit. No fracture of right great toe discovered. -Toenails 1-5 b/l were debrided in length and girth with sterile nail nippers and dremel without iatrogenic bleeding.  -Callus(es) submet head 1 left foot pared utilizing sterile scalpel blade without complication or incident. Total number debrided =1. -Painful porokeratotic lesion(s) submet head 1 right foot pared and enucleated with sterile scalpel blade without incident. Total number of lesions debrided=1. -Patient to report any pedal injuries to medical professional immediately. -Patient/POA to call should there be question/concern in the interim.  Return in about 3 months (around 11/22/2020).  Marzetta Board, DPM

## 2020-12-05 ENCOUNTER — Ambulatory Visit (INDEPENDENT_AMBULATORY_CARE_PROVIDER_SITE_OTHER): Payer: Medicare Other | Admitting: Podiatry

## 2020-12-05 DIAGNOSIS — I739 Peripheral vascular disease, unspecified: Secondary | ICD-10-CM

## 2020-12-05 DIAGNOSIS — Q828 Other specified congenital malformations of skin: Secondary | ICD-10-CM | POA: Diagnosis not present

## 2020-12-05 DIAGNOSIS — L84 Corns and callosities: Secondary | ICD-10-CM | POA: Diagnosis not present

## 2020-12-05 DIAGNOSIS — M79675 Pain in left toe(s): Secondary | ICD-10-CM | POA: Diagnosis not present

## 2020-12-05 DIAGNOSIS — M79674 Pain in right toe(s): Secondary | ICD-10-CM | POA: Diagnosis not present

## 2020-12-05 DIAGNOSIS — B351 Tinea unguium: Secondary | ICD-10-CM

## 2020-12-05 DIAGNOSIS — K5792 Diverticulitis of intestine, part unspecified, without perforation or abscess without bleeding: Secondary | ICD-10-CM | POA: Insufficient documentation

## 2020-12-08 ENCOUNTER — Encounter: Payer: Self-pay | Admitting: Podiatry

## 2020-12-08 NOTE — Progress Notes (Signed)
Subjective: Deborah Ball is a pleasant 87 y.o. female patient seen today for for at risk foot care. Patient has h/o PAD and painful porokeratotic lesion(s) right foot and painful mycotic toenails that limit ambulation. Painful toenails interfere with ambulation. Aggravating factors include wearing enclosed shoe gear. Pain is relieved with periodic professional debridement. Painful porokeratotic lesions are aggravated when weightbearing with and without shoegear. Pain is relieved with periodic professional debridement.   Her daughter, [name], is present during today's visit.  PCP is Raina Mina., MD. Last visit was: 11/04/2020.  Allergies  Allergen Reactions   Elemental Sulfur Rash   Other Anaphylaxis and Other (See Comments)    Phenbarbitol-unknown reaction, but pt had been told allergic Contrast Dye    Statins Other (See Comments)    Joint pain.    Clarithromycin Other (See Comments)    Joint pain    Iodinated Diagnostic Agents Other (See Comments)    Tremors and shaking   Morphine Other (See Comments)    AMS, Agitation   Other reaction(s): Other (See Comments) Joint pain   Aspirin     Bruising per patient   Ciprofloxacin Other (See Comments)    joints   Food     NUTS AND SEEDS == DUE TO diverticulitis  DOES NOT EAT CHOCOLATE OR TAKE CAFFEINE PRODUCTS   Penicillins     Did it involve swelling of the face/tongue/throat, SOB, or low BP? Unknown Did it involve sudden or severe rash/hives, skin peeling, or any reaction on the inside of your mouth or nose? Unknown Did you need to seek medical attention at a hospital or doctor's office? Unknown When did it last happen? unable to clarify If all above answers are "NO", may proceed with cephalosporin use.  Unknown    Phenobarbital     Other reaction(s): UNKNOWN Other reaction(s): Other (See Comments) Was told she is allergic   Sulfa Antibiotics Rash    Objective: Physical Exam  General: Deborah Ball is  a pleasant 87 y.o. Caucasian female, WD, WN in NAD. AAO x 3.   Vascular:  CFT <3 seconds b/l LE. Faintly palpable DP pulses b/l. Nonpalpable PT pulses b/l. Pedal hair absent b/l. Skin temperature gradient WNL b/l. No pain with calf compression b/l. No edema b/l LE. Lower extremity skin temperature gradient within normal limits.   Dermatological:  Pedal skin warm and supple b/l.  No open wounds b/l. No interdigital macerations. Toenails 1-5 b/l elongated, thickened, discolored with subungual debris. +Tenderness with dorsal palpation of nailplates. Hyperkeratotic lesion(s) noted submet head 1 left foot.  Porokeratotic lesion(s) noted submet head 1 right foot. Toenails 1-5 b/l elongated, discolored, dystrophic, thickened, crumbly with subungual debris and tenderness to dorsal palpation.   Musculoskeletal:  Normal muscle strength 5/5 to all lower extremity muscle groups bilaterally. HAV with bunion deformity noted b/l LE. Hammertoe(s) noted to the L 5th toe and R 5th toe.   Neurological:  Protective sensation intact 5/5 intact bilaterally with 10g monofilament b/l. Vibratory sensation intact b/l.   Assessment and Plan:  1. Pain due to onychomycosis of toenails of both feet   2. Callus   3. Porokeratosis   4. PAD (peripheral artery disease) ([facility])     Patient was evaluated and treated and all questions answered. Consent given for treatment as described below: -Examined patient. -Toenails 1-5 b/l were debrided in length and girth with sterile nail nippers and dremel without iatrogenic bleeding.  -Callus(es) submet head 1 left foot pared utilizing sterile scalpel blade  without complication or incident. Total number debrided =1. -Painful porokeratotic lesion(s) submet head 1 right foot pared and enucleated with sterile scalpel blade without incident. Total number of lesions debrided=1. -Patient/POA to call should there be question/concern in the interim.  Return in about 3 months (around  03/07/2021).  Marzetta Board, DPM

## 2021-03-17 DIAGNOSIS — T466X5A Adverse effect of antihyperlipidemic and antiarteriosclerotic drugs, initial encounter: Secondary | ICD-10-CM | POA: Insufficient documentation

## 2021-03-17 DIAGNOSIS — M791 Myalgia, unspecified site: Secondary | ICD-10-CM | POA: Insufficient documentation

## 2021-03-27 ENCOUNTER — Other Ambulatory Visit: Payer: Self-pay

## 2021-03-27 ENCOUNTER — Encounter: Payer: Self-pay | Admitting: Podiatry

## 2021-03-27 ENCOUNTER — Other Ambulatory Visit: Payer: Self-pay | Admitting: *Deleted

## 2021-03-27 ENCOUNTER — Ambulatory Visit (INDEPENDENT_AMBULATORY_CARE_PROVIDER_SITE_OTHER): Payer: Medicare Other | Admitting: Podiatry

## 2021-03-27 DIAGNOSIS — M79675 Pain in left toe(s): Secondary | ICD-10-CM | POA: Diagnosis not present

## 2021-03-27 DIAGNOSIS — Q828 Other specified congenital malformations of skin: Secondary | ICD-10-CM | POA: Diagnosis not present

## 2021-03-27 DIAGNOSIS — M79674 Pain in right toe(s): Secondary | ICD-10-CM | POA: Diagnosis not present

## 2021-03-27 DIAGNOSIS — L84 Corns and callosities: Secondary | ICD-10-CM | POA: Diagnosis not present

## 2021-03-27 DIAGNOSIS — I739 Peripheral vascular disease, unspecified: Secondary | ICD-10-CM

## 2021-03-27 DIAGNOSIS — B351 Tinea unguium: Secondary | ICD-10-CM

## 2021-04-03 NOTE — Progress Notes (Signed)
?Subjective:  ?Patient ID: Deborah Ball, female    DOB: 26-Oct-1922,  MRN: [id number] ? ?88 y.o. female presents with for at risk foot care. Patient has h/o PAD and painful porokeratotic lesion(s) right foot, callus left foot and painful mycotic toenails that limit ambulation. Painful toenails interfere with ambulation. Aggravating factors include wearing enclosed shoe gear. Pain is relieved with periodic professional debridement. Painful porokeratotic lesions are aggravated when weightbearing with and without shoegear. Pain is relieved with periodic professional debridement..   ? ?Patient is accompanied by her daughter on today's visit. ? ?PCP: Raina Mina., MD and last visit was: March 17, 2021. ? ?Review of Systems: Negative except as noted in the HPI.  ? ?Allergies  ?Allergen Reactions  ? Elemental Sulfur Rash  ? Other Anaphylaxis and Other (See Comments)  ?  Phenbarbitol-unknown reaction, but pt had been told allergic ?Contrast Dye ?  ? Statins Other (See Comments)  ?  Joint pain. ?  ? Clarithromycin Other (See Comments)  ?  Joint pain ?  ? Iodinated Contrast Media Other (See Comments)  ?  Tremors and shaking  ? Morphine Other (See Comments)  ?  AMS, Agitation  ? ?Other reaction(s): Other (See Comments) ?Joint pain  ? Aspirin   ?  Bruising per patient  ? Ciprofloxacin Other (See Comments)  ?  joints  ? Food   ?  NUTS AND SEEDS == DUE TO diverticulitis ? ?DOES NOT EAT CHOCOLATE OR TAKE CAFFEINE PRODUCTS  ? Penicillins   ?  Did it involve swelling of the face/tongue/throat, SOB, or low BP? Unknown ?Did it involve sudden or severe rash/hives, skin peeling, or any reaction on the inside of your mouth or nose? Unknown ?Did you need to seek medical attention at a hospital or doctor's office? Unknown ?When did it last happen? unable to clarify ?If all above answers are "NO", may proceed with cephalosporin use. ? ?Unknown ?  ? Phenobarbital   ?  Other reaction(s): UNKNOWN ?Other reaction(s): Other (See  Comments) ?Was told she is allergic  ? Sulfa Antibiotics Rash  ? ? ?Objective:  ?There were no vitals filed for this visit. ?Constitutional Patient is a pleasant 88 y.o. Caucasian female WD, WN in NAD. AAO x 3.  ?Vascular Capillary fill time to digits <3 seconds b/l lower extremities. Faintly palpable DP pulse(s) b/l lower extremities. Nonpalpable PT pulse(s) b/l lower extremities. Pedal hair absent. Lower extremity skin temperature gradient within normal limits. No pain with calf compression b/l. No cyanosis or clubbing noted.   ?Neurologic Normal speech. Protective sensation intact 5/5 intact bilaterally with 10g monofilament b/l. Vibratory sensation intact b/l.  ?Dermatologic Pedal skin is thin shiny, atrophic b/l lower extremities. No open wounds b/l lower extremities. No interdigital macerations b/l lower extremities. Toenails 1-5 b/l elongated, discolored, dystrophic, thickened, crumbly with subungual debris and tenderness to dorsal palpation. Hyperkeratotic lesion(s) 1st metatarsal head left foot.  No erythema, no edema, no drainage, no fluctuance. Porokeratotic lesion(s) submet head 1 right foot. No erythema, no edema, no drainage, no fluctuance.  ?Orthopedic: Normal muscle strength 5/5 to all lower extremity muscle groups bilaterally. HAV with bunion deformity noted b/l LE. Hammertoe(s) noted to the bilateral 5th toes.  ? ?Assessment:  ? ?1. Pain due to onychomycosis of toenails of both feet   ?2. Callus   ?3. Porokeratosis   ?4. PAD (peripheral artery disease) ([facility])   ? ?Plan:  ?Patient was evaluated and treated and all questions answered. ?Consent given for treatment as described below: ?-  Mycotic toenails 1-5 bilaterally were debrided in length and girth with sterile nail nippers and dremel without incident. ?-Callus(es) 1st metatarsal head left foot pared utilizing sterile scalpel blade without complication or incident. Total number debrided =1. ?-Painful porokeratotic lesion(s) submet head 1 right foot  pared and enucleated with sterile scalpel blade without incident. Total number of lesions debrided=1. ?-Patient/POA to call should there be question/concern in the interim. ? ?Return in about 3 months (around 06/27/2021). ? ?Marzetta Board, DPM ?

## 2021-06-17 DIAGNOSIS — K5909 Other constipation: Secondary | ICD-10-CM | POA: Insufficient documentation

## 2021-07-03 ENCOUNTER — Ambulatory Visit: Payer: Medicare Other | Admitting: Podiatry

## 2021-07-03 DIAGNOSIS — I361 Nonrheumatic tricuspid (valve) insufficiency: Secondary | ICD-10-CM

## 2021-07-03 DIAGNOSIS — I340 Nonrheumatic mitral (valve) insufficiency: Secondary | ICD-10-CM

## 2021-07-04 DIAGNOSIS — I4891 Unspecified atrial fibrillation: Secondary | ICD-10-CM

## 2021-07-10 DIAGNOSIS — Z8673 Personal history of transient ischemic attack (TIA), and cerebral infarction without residual deficits: Secondary | ICD-10-CM | POA: Insufficient documentation

## 2021-09-18 ENCOUNTER — Encounter: Payer: Self-pay | Admitting: Podiatry

## 2021-09-18 ENCOUNTER — Ambulatory Visit (INDEPENDENT_AMBULATORY_CARE_PROVIDER_SITE_OTHER): Payer: Medicare Other | Admitting: Podiatry

## 2021-09-18 DIAGNOSIS — M79675 Pain in left toe(s): Secondary | ICD-10-CM

## 2021-09-18 DIAGNOSIS — Q828 Other specified congenital malformations of skin: Secondary | ICD-10-CM | POA: Diagnosis not present

## 2021-09-18 DIAGNOSIS — I739 Peripheral vascular disease, unspecified: Secondary | ICD-10-CM | POA: Diagnosis not present

## 2021-09-18 DIAGNOSIS — L84 Corns and callosities: Secondary | ICD-10-CM | POA: Diagnosis not present

## 2021-09-18 DIAGNOSIS — M79674 Pain in right toe(s): Secondary | ICD-10-CM | POA: Diagnosis not present

## 2021-09-18 DIAGNOSIS — B351 Tinea unguium: Secondary | ICD-10-CM

## 2021-09-25 NOTE — Progress Notes (Signed)
Subjective:  Patient ID: Deborah Ball, female    DOB: 09-17-1922,  MRN: [id number]  Deborah Ball presents to clinic today for for at risk foot care. Patient has h/o PAD and callus(es) b/l lower extremities and painful thick toenails that are difficult to trim. Painful toenails interfere with ambulation. Aggravating factors include wearing enclosed shoe gear. Pain is relieved with periodic professional debridement. Painful calluses are aggravated when weightbearing with and without shoegear. Pain is relieved with periodic professional debridement.  Patient is accompanied by her daughter on today's visit.  New problem(s): None.   PCP is Raina Mina., MD , and last visit was  July 23, 2021.  Allergies  Allergen Reactions   Elemental Sulfur Rash   Other Anaphylaxis and Other (See Comments)    Phenbarbitol-unknown reaction, but pt had been told allergic Contrast Dye    Statins Other (See Comments)    Joint pain.    Clarithromycin Other (See Comments)    Joint pain    Iodinated Contrast Media Other (See Comments)    Tremors and shaking   Morphine Other (See Comments)    AMS, Agitation   Other reaction(s): Other (See Comments) Joint pain   Aspirin     Bruising per patient   Ciprofloxacin Other (See Comments)    joints   Food     NUTS AND SEEDS == DUE TO diverticulitis  DOES NOT EAT CHOCOLATE OR TAKE CAFFEINE PRODUCTS   Penicillins     Did it involve swelling of the face/tongue/throat, SOB, or low BP? Unknown Did it involve sudden or severe rash/hives, skin peeling, or any reaction on the inside of your mouth or nose? Unknown Did you need to seek medical attention at a hospital or doctor's office? Unknown When did it last happen? unable to clarify If all above answers are "NO", may proceed with cephalosporin use.  Unknown    Phenobarbital     Other reaction(s): UNKNOWN Other reaction(s): Other (See Comments) Was told she is allergic   Sulfa Antibiotics  Rash    Review of Systems: Negative except as noted in the HPI.  Objective: No changes noted in today's physical examination. Deborah Ball is a pleasant 88 y.o. y.o. female in NAD. AAO x 3.  Vascular Examination: CFT <3 seconds b/l. DP pulses faintly palpable b/l. PT pulses diminished b/l. Digital hair absent. Skin temperature gradient warm to warm b/l. No pain with calf compression. No ischemia or gangrene. No cyanosis or clubbing noted b/l.    Neurological Examination: Sensation grossly intact b/l with 10 gram monofilament. Vibratory sensation intact b/l.   Dermatological Examination: Pedal skin warm and supple b/l. Toenails 1-5 b/l thick, discolored, elongated with subungual debris and pain on dorsal palpation.  Hyperkeratotic lesion(s) submet head 1 left foot.  No erythema, no edema, no drainage, no fluctuance. Porokeratotic lesion(s) submet head 1 right foot. No erythema, no edema, no drainage, no fluctuance.  Musculoskeletal Examination: Muscle strength 5/5 to b/l LE. HAV with bunion deformity noted b/l LE. Hammertoe(s) noted to the bilateral 5th toes.  Radiographs: None  Assessment/Plan: 1. Pain due to onychomycosis of toenails of both feet   2. Callus   3. Porokeratosis   4. PAD (peripheral artery disease) ([place])     -Examined patient. -Patient to continue soft, supportive shoe gear daily. -Toenails 1-5 b/l were debrided in length and girth with sterile nail nippers and dremel without iatrogenic bleeding.  -Callus(es) submet head 1 left foot pared utilizing sterile scalpel blade without  complication or incident. Total number debrided =1. -Porokeratotic lesion(s) submet head 1 right foot pared and enucleated with sterile scalpel blade without incident. Total number of lesions debrided=1. -Patient/POA to call should there be question/concern in the interim.   Return in about 3 months (around 12/19/2021).  Marzetta Board, DPM

## 2021-11-26 DEATH — deceased

## 2022-01-01 ENCOUNTER — Ambulatory Visit: Payer: Medicare Other | Admitting: Podiatry
